# Patient Record
Sex: Male | Born: 1955 | Race: Black or African American | Hispanic: No | Marital: Single | Smoking: Current every day smoker
Health system: Southern US, Community
[De-identification: ages and names within clinical notes are randomized; demographics above are authoritative.]

## PROBLEM LIST (undated history)

## (undated) DIAGNOSIS — Z789 Other specified health status: Secondary | ICD-10-CM

## (undated) DIAGNOSIS — Z720 Tobacco use: Secondary | ICD-10-CM

## (undated) DIAGNOSIS — Z7289 Other problems related to lifestyle: Secondary | ICD-10-CM

## (undated) HISTORY — DX: Other specified health status: Z78.9

## (undated) HISTORY — DX: Other problems related to lifestyle: Z72.89

## (undated) HISTORY — PX: WISDOM TOOTH EXTRACTION: SHX21

## (undated) HISTORY — PX: ABDOMINAL HERNIA REPAIR: SHX539

## (undated) HISTORY — DX: Tobacco use: Z72.0

---

## 2010-03-19 ENCOUNTER — Emergency Department (HOSPITAL_COMMUNITY): Admission: EM | Admit: 2010-03-19 | Discharge: 2010-03-19 | Payer: Self-pay | Admitting: Emergency Medicine

## 2017-06-20 ENCOUNTER — Emergency Department (HOSPITAL_COMMUNITY): Admission: EM | Admit: 2017-06-20 | Discharge: 2017-06-20 | Discharge status: Absent without leave | Payer: Self-pay

## 2017-06-22 ENCOUNTER — Other Ambulatory Visit: Payer: Self-pay

## 2017-06-22 ENCOUNTER — Emergency Department (HOSPITAL_COMMUNITY)
Admission: EM | Admit: 2017-06-22 | Discharge: 2017-06-22 | Disposition: A | Discharge status: Home / Self Care | Payer: Medicaid Other | Attending: Emergency Medicine | Admitting: Emergency Medicine

## 2017-06-22 ENCOUNTER — Emergency Department (HOSPITAL_COMMUNITY): Payer: Medicaid Other

## 2017-06-22 ENCOUNTER — Encounter (HOSPITAL_COMMUNITY): Payer: Self-pay | Admitting: Emergency Medicine

## 2017-06-22 DIAGNOSIS — M940 Chondrocostal junction syndrome [Tietze]: Secondary | ICD-10-CM | POA: Diagnosis not present

## 2017-06-22 DIAGNOSIS — R079 Chest pain, unspecified: Secondary | ICD-10-CM | POA: Diagnosis present

## 2017-06-22 DIAGNOSIS — F17200 Nicotine dependence, unspecified, uncomplicated: Secondary | ICD-10-CM | POA: Insufficient documentation

## 2017-06-22 LAB — BASIC METABOLIC PANEL
ANION GAP: 4 — AB (ref 5–15)
BUN: 11 mg/dL (ref 6–20)
CALCIUM: 9.4 mg/dL (ref 8.9–10.3)
CO2: 27 mmol/L (ref 22–32)
Chloride: 107 mmol/L (ref 101–111)
Creatinine, Ser: 1.18 mg/dL (ref 0.61–1.24)
GFR calc Af Amer: >60 mL/min (ref >60)
GFR calc non Af Amer: >60 mL/min (ref >60)
GLUCOSE: 99 mg/dL (ref 65–99)
POTASSIUM: 4 mmol/L (ref 3.5–5.1)
SODIUM: 138 mmol/L (ref 135–145)

## 2017-06-22 LAB — CBC
HCT: 44.1 % (ref 39.0–52.0)
Hemoglobin: 14.4 g/dL (ref 13.0–17.0)
MCH: 29 pg (ref 26.0–34.0)
MCHC: 32.7 g/dL (ref 30.0–36.0)
MCV: 88.7 fL (ref 78.0–100.0)
PLATELETS: 290 10*3/uL (ref 150–400)
RBC: 4.97 MIL/uL (ref 4.22–5.81)
RDW: 14.2 % (ref 11.5–15.5)
WBC: 7 10*3/uL (ref 4.0–10.5)

## 2017-06-22 LAB — I-STAT TROPONIN, ED: TROPONIN I, POC: 0 ng/mL (ref 0.00–0.08)

## 2017-06-22 MED ORDER — CYCLOBENZAPRINE HCL 10 MG PO TABS: 10.0000 mg | ORAL_TABLET | Freq: Two times a day (BID) | ORAL | 0 refills | Status: DC | PRN

## 2017-06-22 MED ORDER — IBUPROFEN 200 MG PO TABS
400.0000 mg | ORAL_TABLET | Freq: Once | ORAL | Status: AC
  Administered 2017-06-22: 400 mg via ORAL
  Filled 2017-06-22: qty 2

## 2017-06-22 MED ORDER — NAPROXEN 500 MG PO TABS: 500.0000 mg | ORAL_TABLET | Freq: Two times a day (BID) | ORAL | 0 refills | Status: DC

## 2017-06-22 NOTE — Discharge Instructions (Signed)
Take the medications as prescribed, follow-up with a primary care doctor if the symptoms persist

## 2017-06-22 NOTE — ED Triage Notes (Signed)
Pt c/o R sharp chest pain x 2-3 weeks. Pt states pain is present with bending over, with movement and with movment of R arm. Denies SOB. Pt denies injury.

## 2017-06-22 NOTE — ED Provider Notes (Signed)
[facility]-EMERGENCY DEPT Provider Note   CSN: [id number] Arrival date & time: 06/22/17  0754     History   Chief Complaint Chief Complaint  Patient presents with  . Chest Pain    r chest    HPI George Rocha is a 62 y.o. male.  HPI Patient presents to the emergency room for evaluation of right-sided chest pain.  Patient states the symptoms started a few weeks ago.  Since then he has had constant pain on the right side of his chest.  It increases whenever he tries to bend over to make certain movements including movements of his right arm.  He denies any falls or recent injuries.  He is not having any shortness of breath.  He denies any radiation of the pain.  No numbness or weakness.  No trouble with leg swelling.  No fevers or coughing.  Patient denies any history of heart or lung disease History reviewed. No pertinent past medical history.  There are no active problems to display for this patient.   History reviewed. No pertinent surgical history.     Home Medications    Prior to Admission medications   Medication Sig Start Date End Date Taking? Authorizing Provider  cyclobenzaprine (FLEXERIL) 10 MG tablet Take 1 tablet (10 mg total) by mouth 2 (two) times daily as needed for muscle spasms. 06/22/17   Linwood Dibbles, MD  naproxen (NAPROSYN) 500 MG tablet Take 1 tablet (500 mg total) by mouth 2 (two) times daily. 06/22/17   Linwood Dibbles, MD    Family History History reviewed. No pertinent family history.  Social History Social History   Tobacco Use  . Smoking status: Current Every Day Smoker    Packs/day: 0.50  . Smokeless tobacco: Never Used  Substance Use Topics  . Alcohol use: Yes  . Drug use: No     Allergies   Patient has no known allergies.   Review of Systems Review of Systems  All other systems reviewed and are negative.    Physical Exam Updated Vital Signs BP 114/89 (BP Location: Left Arm)   Pulse (!) 56   Temp 97.7 F (36.5 C)  (Oral)   Resp 16   SpO2 100%   Physical Exam  Constitutional: He appears well-developed and well-nourished. No distress.  HENT:  Head: Normocephalic and atraumatic.  Right Ear: External ear normal.  Left Ear: External ear normal.  Eyes: Conjunctivae are normal. Right eye exhibits no discharge. Left eye exhibits no discharge. No scleral icterus.  Neck: Neck supple. No tracheal deviation present.  Cardiovascular: Normal rate, regular rhythm and intact distal pulses.  Pulmonary/Chest: Effort normal and breath sounds normal. No stridor. No respiratory distress. He has no wheezes. He has no rales.  Abdominal: Soft. Bowel sounds are normal. He exhibits no distension. There is no tenderness. There is no rebound and no guarding.  Musculoskeletal: He exhibits no edema or tenderness.  Neurological: He is alert. He has normal strength. No cranial nerve deficit (no facial droop, extraocular movements intact, no slurred speech) or sensory deficit. He exhibits normal muscle tone. He displays no seizure activity. Coordination normal.  Skin: Skin is warm and dry. No rash noted.  Psychiatric: He has a normal mood and affect.  Nursing note and vitals reviewed.    ED Treatments / Results  Labs (all labs ordered are listed, but only abnormal results are displayed) Labs Reviewed  BASIC METABOLIC PANEL - Abnormal; Notable for the following components:  Result Value   Anion gap 4 (*)    All other components within normal limits  CBC  I-STAT TROPONIN, ED    EKG  EKG Interpretation  Date/Time:  "Sunday June 22 2017 08:53:43 EST Ventricular Rate:  59 PR Interval:    QRS Duration: 102 QT Interval:  416 QTC Calculation: 413 R Axis:   66 Text Interpretation:  Sinus rhythm RSR' in V1 or V2, probably normal variant Minimal ST elevation, anterior leads Baseline wander in lead(s) II III aVL aVF V1 No old tracing to compare Confirmed by Art Levan ([id number]) on 06/22/2017 9:31:58 AM        Radiology Dg Chest 2 View  Result Date: 06/22/2017 CLINICAL DATA:  RIGHT side chest pain for 3-4 weeks, smoker EXAM: CHEST  2 VIEW COMPARISON:  None. FINDINGS: Normal heart size, mediastinal contours, and pulmonary vascularity. Mild peribronchial thickening. No pulmonary infiltrate, pleural effusion, or pneumothorax. Bones demineralized. Multiple EKG leads project over chest. IMPRESSION: Bronchitic changes without infiltrate. Electronically Signed   By: Mark  Boles M.D.   On: 06/22/2017 09:39    Procedures Procedures (including critical care time)  Medications Ordered in ED Medications  ibuprofen (ADVIL,MOTRIN) tablet 400 mg (400 mg Oral Given 06/22/17 1041)     Initial Impression / Assessment and Plan / ED Course  I have reviewed the triage vital signs and the nursing notes.  Pertinent labs & imaging results that were available during my care of the patient were reviewed by me and considered in my medical decision making (see chart for details).   Patient's symptoms are consistent with chest wall pain.  He has had several week duration.  Laboratory tests are reassuring.  I doubt acute cardiac syndrome, pneumonia, pulmonary embolism, or other emergent condition.  Plan on discharge home with medications for pain and muscle relaxants.  Follow-up with primary care doctor if not resolved  Final Clinical Impressions(s) / ED Diagnoses   Final diagnoses:  Costochondritis    ED Discharge Orders        Ordered    naproxen (NAPROSYN) 500 MG tablet  2 times daily     06/22/17 1125    cyclobenzaprine (FLEXERIL) 10 MG tablet  2 times daily PRN     01" /06/19 1125       Linwood DibblesKnapp, Eivan Gallina, MD 06/22/17 1127

## 2017-11-22 ENCOUNTER — Encounter (HOSPITAL_COMMUNITY): Payer: Self-pay | Admitting: Emergency Medicine

## 2017-11-22 ENCOUNTER — Emergency Department (HOSPITAL_COMMUNITY): Payer: Medicaid Other

## 2017-11-22 ENCOUNTER — Emergency Department (HOSPITAL_COMMUNITY)
Admission: EM | Admit: 2017-11-22 | Discharge: 2017-11-22 | Disposition: A | Discharge status: Home / Self Care | Payer: Medicaid Other | Attending: Emergency Medicine | Admitting: Emergency Medicine

## 2017-11-22 DIAGNOSIS — F17210 Nicotine dependence, cigarettes, uncomplicated: Secondary | ICD-10-CM | POA: Diagnosis not present

## 2017-11-22 DIAGNOSIS — R0789 Other chest pain: Secondary | ICD-10-CM | POA: Insufficient documentation

## 2017-11-22 DIAGNOSIS — R079 Chest pain, unspecified: Secondary | ICD-10-CM | POA: Diagnosis present

## 2017-11-22 LAB — CBC
HCT: 41.3 % (ref 39.0–52.0)
Hemoglobin: 13.3 g/dL (ref 13.0–17.0)
MCH: 28.9 pg (ref 26.0–34.0)
MCHC: 32.2 g/dL (ref 30.0–36.0)
MCV: 89.8 fL (ref 78.0–100.0)
PLATELETS: 266 10*3/uL (ref 150–400)
RBC: 4.6 MIL/uL (ref 4.22–5.81)
RDW: 13.6 % (ref 11.5–15.5)
WBC: 7.7 10*3/uL (ref 4.0–10.5)

## 2017-11-22 LAB — BASIC METABOLIC PANEL
Anion gap: 9 (ref 5–15)
BUN: 10 mg/dL (ref 6–20)
CALCIUM: 8.6 mg/dL — AB (ref 8.9–10.3)
CO2: 24 mmol/L (ref 22–32)
CREATININE: 1.18 mg/dL (ref 0.61–1.24)
Chloride: 108 mmol/L (ref 101–111)
GFR calc non Af Amer: >60 mL/min (ref >60)
GLUCOSE: 82 mg/dL (ref 65–99)
Potassium: 3.6 mmol/L (ref 3.5–5.1)
Sodium: 141 mmol/L (ref 135–145)

## 2017-11-22 LAB — I-STAT TROPONIN, ED: TROPONIN I, POC: 0 ng/mL (ref 0.00–0.08)

## 2017-11-22 MED ORDER — IBUPROFEN 200 MG PO TABS
600.0000 mg | ORAL_TABLET | Freq: Once | ORAL | Status: AC
  Administered 2017-11-22: 600 mg via ORAL
  Filled 2017-11-22: qty 3

## 2017-11-22 MED ORDER — IBUPROFEN 600 MG PO TABS: 600.0000 mg | ORAL_TABLET | Freq: Four times a day (QID) | ORAL | 0 refills | Status: DC | PRN

## 2017-11-22 NOTE — ED Notes (Signed)
Patient transported to X-ray 

## 2017-11-22 NOTE — ED Triage Notes (Signed)
Pt reports having constant mid chest pains every day for weeks to months. Reports pain is worse with bending over, movement and coughing. Denies productive cough. Was seen here before for chest pains and states he was told it was muscle related.

## 2017-11-22 NOTE — ED Provider Notes (Signed)
[facility]-EMERGENCY DEPT Provider Note   CSN: [id number] Arrival date & time: 11/22/17  1214     History   Chief Complaint Chief Complaint  Patient presents with  . Chest Pain    HPI George Rocha is a 62 y.o. male.  George Rocha is a 62 y.o. Male history of tobacco and alcohol use, otherwise healthy, presents to the emergency department for evaluation of several months of mid chest pain every day.  She reports he was seen for similar symptoms back in January and diagnosed with costochondritis, he was initially treated with Naprosyn and Robaxin but reports these medications did not help at all and he discontinued them, and has been continuing to have chest pain daily.  Pain is made worse with movement, especially with bending over or lifting things.  Patient reports he works currently at 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  Chest pain is not exertional or pleuritic, it does not radiate to the arm neck or jaw.  Patient denies any associated shortness of breath or pain with breathing.  No associated lightheadedness, dizziness or syncope.  Patient denies fevers or productive cough.  Patient has not tried anything to treat this pain recently.  No other aggravating or alleviating factors.  He denies any diaphoresis, nausea, vomiting or abdominal pain.  Patient denies any recent long distance travel or surgeries, no lower extremity pain or swelling, no history of DVT or PE, no hemoptysis.     History reviewed. No pertinent past medical history.  There are no active problems to display for this patient.   History reviewed. No pertinent surgical history.      Home Medications    Prior to Admission medications   Medication Sig Start Date End Date Taking? Authorizing Provider  cyclobenzaprine (FLEXERIL) 10 MG tablet Take 1 tablet (10 mg total) by mouth 2 (two) times daily as needed for muscle spasms. 06/22/17   Linwood Dibbles, MD  naproxen (NAPROSYN) 500  MG tablet Take 1 tablet (500 mg total) by mouth 2 (two) times daily. 06/22/17   Linwood Dibbles, MD    Family History No family history on file.  Social History Social History   Tobacco Use  . Smoking status: Current Every Day Smoker    Packs/day: 0.50  . Smokeless tobacco: Never Used  Substance Use Topics  . Alcohol use: Yes  . Drug use: No     Allergies   Patient has no known allergies.   Review of Systems Review of Systems  Constitutional: Negative for chills and fever.  HENT: Negative for congestion, rhinorrhea and sore throat.   Eyes: Negative for visual disturbance.  Respiratory: Negative for cough, chest tightness, shortness of breath and wheezing.   Cardiovascular: Positive for chest pain. Negative for palpitations and leg swelling.  Gastrointestinal: Negative for abdominal pain, nausea and vomiting.  Genitourinary: Negative for dysuria and frequency.  Musculoskeletal: Negative for arthralgias and myalgias.  Skin: Negative for color change and rash.  Neurological: Negative for dizziness and seizures.     Physical Exam Updated Vital Signs BP (!) 141/101 (BP Location: Left Arm)   Pulse 78   Temp 97.9 F (36.6 C) (Oral)   Resp 15   Ht 6\' 2"  (1.88 m)   Wt 86.2 kg (190 lb)   SpO2 98%   BMI 24.39 kg/m   Physical Exam  Constitutional: He appears well-developed and well-nourished. No distress.  HENT:  Head: Normocephalic and atraumatic.  Mouth/Throat: Oropharynx  is clear and moist.  Eyes: Right eye exhibits no discharge. Left eye exhibits no discharge.  Neck: Neck supple.  Cardiovascular: Normal rate, regular rhythm, normal heart sounds and intact distal pulses. Exam reveals no gallop and no friction rub.  No murmur heard. Pulses:      Radial pulses are 2+ on the right side, and 2+ on the left side.       Posterior tibial pulses are 2+ on the right side, and 2+ on the left side.  Pulmonary/Chest: Effort normal and breath sounds normal. No stridor. No  respiratory distress. He has no wheezes. He has no rales. He exhibits tenderness.  Respirations equal and unlabored, patient able to speak in full sentences, lungs clear to auscultation bilaterally, chest tender to palpation over the left costochondral margin  Abdominal: Soft. Bowel sounds are normal.  Neurological: He is alert. Coordination normal.  Skin: He is not diaphoretic.  Psychiatric: He has a normal mood and affect. His behavior is normal.  Nursing note and vitals reviewed.    ED Treatments / Results  Labs (all labs ordered are listed, but only abnormal results are displayed) Labs Reviewed  BASIC METABOLIC PANEL - Abnormal; Notable for the following components:      Result Value   Calcium 8.6 (*)    All other components within normal limits  CBC  I-STAT TROPONIN, ED    EKG EKG Interpretation  Date/Time:  Saturday November 22 2017 12:19:59 EDT Ventricular Rate:  82 PR Interval:    QRS Duration: 96 QT Interval:  365 QTC Calculation: 427 R Axis:   69 Text Interpretation:  Sinus rhythm RSR' in V1 or V2, right VCD or RVH Baseline wander in lead(s) V5 Confirmed by Tilden Fossaees, Elizabeth [phone]([id number]) on 11/22/2017 1:01:10 PM   Radiology Dg Chest 2 View  Result Date: 11/22/2017 CLINICAL DATA:  Left-sided chest pain and shortness of breath for 2 months. EXAM: CHEST - 2 VIEW COMPARISON:  None. FINDINGS: The heart size and mediastinal contours are within normal limits. Both lungs are clear. No evidence of pneumothorax or pleural effusion. The visualized skeletal structures are unremarkable. IMPRESSION: No active cardiopulmonary disease. Electronically Signed   By: Myles RosenthalJohn  Stahl M.D.   On: 11/22/2017 13:29    Procedures Procedures (including critical care time)  Medications Ordered in ED Medications  ibuprofen (ADVIL,MOTRIN) tablet 600 mg (has no administration in time range)     Initial Impression / Assessment and Plan / ED Course  I have reviewed the triage vital signs and the nursing  notes.  Pertinent labs & imaging results that were available during my care of the patient were reviewed by me and considered in my medical decision making (see chart for details).  Patient is to be discharged with recommendation to follow up with PCP in regards to today's hospital visit. Chest pain is not likely of cardiac or pulmonary etiology d/t presentation, PERC negative, VSS, no tracheal deviation, no JVD or new murmur, RRR, breath sounds equal bilaterally, EKG without acute abnormalities, negative troponin, and negative CXR.  Chest pain is reproducible on exam with palpation over the left costochondral margin.  Discussed treatment with NSAIDs and over-the-counter muscle rubs.  Discussed importance of establishing a primary care doctor, patient's alcohol and tobacco use does put him at risk for esophageal cancer.  Pt has been advised to return to the ED if CP becomes exertional, associated with diaphoresis or nausea, radiates to left jaw/arm, worsens or becomes concerning in any way. Pt appears reliable for  follow up and is agreeable to discharge.   Case has been discussed with and seen by Dr. Madilyn Hook who agrees with the above plan to discharge.    Final Clinical Impressions(s) / ED Diagnoses   Final diagnoses:  Chest wall pain  Atypical chest pain    ED Discharge Orders        Ordered    ibuprofen (ADVIL,MOTRIN) 600 MG tablet  Every 6 hours PRN     11/22/17 1424       Dartha Lodge, [place] 11/22/17 1425    Tilden Fossa, MD 11/25/17 [phone]

## 2017-11-22 NOTE — Discharge Instructions (Signed)
Evaluation today is very reassuring, EKG, labs and chest x-ray do not suggest an acute problem with your heart or lungs causing your symptoms.  This is likely musculoskeletal pain.  Please continue taking ibuprofen regularly, you can also try over-the-counter salon pas patches or Biofreeze over the area where you are having pain.  It is extremely important that you follow-up with primary care, you can follow-up with the [facility] or use the phone number provided in your paperwork today to establish a primary care doctor.  As we discussed you are at risk for esophageal cancer with your smoking and alcohol use and this should be evaluated.

## 2018-04-06 ENCOUNTER — Other Ambulatory Visit: Payer: Self-pay

## 2018-04-06 ENCOUNTER — Emergency Department (HOSPITAL_COMMUNITY)
Admission: EM | Admit: 2018-04-06 | Discharge: 2018-04-06 | Disposition: A | Discharge status: Home / Self Care | Payer: Medicaid Other | Attending: Emergency Medicine | Admitting: Emergency Medicine

## 2018-04-06 ENCOUNTER — Encounter (HOSPITAL_COMMUNITY): Payer: Self-pay | Admitting: Emergency Medicine

## 2018-04-06 DIAGNOSIS — Z5321 Procedure and treatment not carried out due to patient leaving prior to being seen by health care provider: Secondary | ICD-10-CM | POA: Insufficient documentation

## 2018-04-06 DIAGNOSIS — R42 Dizziness and giddiness: Secondary | ICD-10-CM | POA: Diagnosis present

## 2018-04-06 LAB — CBG MONITORING, ED: Glucose-Capillary: 93 mg/dL (ref 70–99)

## 2018-04-06 NOTE — ED Triage Notes (Addendum)
Pt reports he was upstairs with his mom who was about to have a procedure done when he began feeling lightheaded and layed down, was diaphoretic. He was brought down by rapid response. Pt denise pain anywhere. Pt states "I feel fine now, nothing is wrong with me."

## 2018-04-06 NOTE — ED Notes (Signed)
Pt wants to leave. Refused blood work and states he "truly feels better and wants to go be with his family."

## 2018-08-29 ENCOUNTER — Emergency Department (HOSPITAL_COMMUNITY)
Admission: EM | Admit: 2018-08-29 | Discharge: 2018-08-29 | Disposition: A | Discharge status: Home / Self Care | Payer: Medicaid - Out of State | Attending: Emergency Medicine | Admitting: Emergency Medicine

## 2018-08-29 ENCOUNTER — Other Ambulatory Visit: Payer: Self-pay

## 2018-08-29 ENCOUNTER — Encounter (HOSPITAL_COMMUNITY): Payer: Self-pay

## 2018-08-29 DIAGNOSIS — M79601 Pain in right arm: Secondary | ICD-10-CM | POA: Diagnosis present

## 2018-08-29 DIAGNOSIS — R202 Paresthesia of skin: Secondary | ICD-10-CM

## 2018-08-29 DIAGNOSIS — F17210 Nicotine dependence, cigarettes, uncomplicated: Secondary | ICD-10-CM | POA: Insufficient documentation

## 2018-08-29 MED ORDER — METHYLPREDNISOLONE 4 MG PO TBPK: ORAL_TABLET | ORAL | 0 refills | Status: DC

## 2018-08-29 MED ORDER — KETOROLAC TROMETHAMINE 60 MG/2ML IM SOLN
60.0000 mg | Freq: Once | INTRAMUSCULAR | Status: AC
  Administered 2018-08-29: 60 mg via INTRAMUSCULAR
  Filled 2018-08-29: qty 2

## 2018-08-29 MED ORDER — IBUPROFEN 800 MG PO TABS: 800.0000 mg | ORAL_TABLET | Freq: Three times a day (TID) | ORAL | 0 refills | Status: AC | PRN

## 2018-08-29 NOTE — ED Triage Notes (Signed)
Right arm pain from shoulder to elbow states it has been going on for about 2 weeks and gets worse when he is lifting or using it a lot good csm noted and numbness comes and goes.

## 2018-08-29 NOTE — ED Provider Notes (Signed)
[facility]-EMERGENCY DEPT Provider Note   CSN: [id number] Arrival date & time: 08/29/18  1956    History   Chief Complaint Chief Complaint  George Rocha presents with  . Arm Pain    HPI George Rocha is a 63 y.o. male.     The history is provided by the George Rocha.  Illness  Location:  Right arm Quality:  Pain and numbness/tingling Severity:  Mild Onset quality:  Gradual Timing:  Intermittent Progression:  Waxing and waning Chronicity:  New Context:  George Rocha states pain in RUE arm between shoulder and elbow over the last few weeks that is worse when lifting objects.  Relieved by:  Nothing Worsened by:  Movement Associated symptoms: no abdominal pain, no chest pain, no congestion, no cough, no ear pain, no fatigue, no fever, no headaches, no rash, no rhinorrhea, no shortness of breath, no sore throat and no vomiting     History reviewed. No pertinent past medical history.  There are no active problems to display for this George Rocha.   History reviewed. No pertinent surgical history.      Home Medications    Prior to Admission medications   Medication Sig Start Date End Date Taking? Authorizing Provider  cyclobenzaprine (FLEXERIL) 10 MG tablet Take 1 tablet (10 mg total) by mouth 2 (two) times daily as needed for muscle spasms. 06/22/17   Linwood Dibbles, MD  ibuprofen (ADVIL,MOTRIN) 800 MG tablet Take 1 tablet (800 mg total) by mouth every 8 (eight) hours as needed for up to 30 days for mild pain. 08/29/18 09/28/18  Meeyah Ovitt, DO  methylPREDNISolone (MEDROL DOSEPAK) 4 MG TBPK tablet Follow package instructions 08/29/18   Virgina Norfolk, DO  naproxen (NAPROSYN) 500 MG tablet Take 1 tablet (500 mg total) by mouth 2 (two) times daily. 06/22/17   Linwood Dibbles, MD    Family History History reviewed. No pertinent family history.  Social History Social History   Tobacco Use  . Smoking status: Current Every Day Smoker    Packs/day: 0.50  . Smokeless tobacco:  Never Used  Substance Use Topics  . Alcohol use: Yes  . Drug use: No     Allergies   George Rocha has no known allergies.   Review of Systems Review of Systems  Constitutional: Negative for chills, fatigue and fever.  HENT: Negative for congestion, ear pain, rhinorrhea and sore throat.   Eyes: Negative for pain and visual disturbance.  Respiratory: Negative for cough and shortness of breath.   Cardiovascular: Negative for chest pain and palpitations.  Gastrointestinal: Negative for abdominal pain and vomiting.  Genitourinary: Negative for dysuria and hematuria.  Musculoskeletal: Negative for arthralgias, back pain, gait problem and joint swelling.  Skin: Negative for color change and rash.  Neurological: Positive for numbness. Negative for dizziness, tremors, seizures, syncope, facial asymmetry, speech difficulty, weakness, light-headedness and headaches.  All other systems reviewed and are negative.    Physical Exam Updated Vital Signs  ED Triage Vitals  Enc Vitals Group     BP 08/29/18 2003 115/80     Pulse Rate 08/29/18 2003 80     Resp 08/29/18 2003 18     Temp 08/29/18 2003 97.9 F (36.6 C)     Temp Source 08/29/18 2003 Oral     SpO2 08/29/18 2003 97 %     Weight 08/29/18 2007 185 lb (83.9 kg)     Height 08/29/18 2007 6' (1.829 m)     Head Circumference --      Peak  Flow --      Pain Score 08/29/18 2007 8     Pain Loc --      Pain Edu? --      Excl. in GC? --     Physical Exam Vitals signs and nursing note reviewed.  Constitutional:      General: George Rocha is not in acute distress.    Appearance: George Rocha is well-developed. George Rocha is not ill-appearing.  HENT:     Head: Normocephalic and atraumatic.     Nose: Nose normal.     Mouth/Throat:     Mouth: Mucous membranes are moist.  Eyes:     Extraocular Movements: Extraocular movements intact.     Conjunctiva/sclera: Conjunctivae normal.     Pupils: Pupils are equal, round, and reactive to light.  Neck:     Musculoskeletal:  Normal range of motion and neck supple. No muscular tenderness.  Cardiovascular:     Rate and Rhythm: Normal rate and regular rhythm.     Heart sounds: No murmur.  Pulmonary:     Effort: Pulmonary effort is normal. No respiratory distress.     Breath sounds: Normal breath sounds.  Abdominal:     Palpations: Abdomen is soft.     Tenderness: There is no abdominal tenderness.  Musculoskeletal: Normal range of motion.        General: Tenderness present.     Comments: George Rocha tender within the right bicep area on exam, no tenderness around the shoulder, negative Spurling's test, no midline spinal tenderness, normal range of motion in neck  Skin:    General: Skin is warm and dry.  Neurological:     General: No focal deficit present.     Mental Status: George Rocha is alert and oriented to person, place, and time.     Cranial Nerves: No cranial nerve deficit.     Sensory: No sensory deficit.     Motor: No weakness.     Coordination: Coordination normal.     Gait: Gait normal.     Comments: 5+5 strength throughout, normal sensation throughout, normal finger-to-nose finger, normal speech      ED Treatments / Results  Labs (all labs ordered are listed, but only abnormal results are displayed) Labs Reviewed - No data to display  EKG None  Radiology No results found.  Procedures Procedures (including critical care time)  Medications Ordered in ED Medications  ketorolac (TORADOL) injection 60 mg (has no administration in time range)     Initial Impression / Assessment and Plan / ED Course  I have reviewed the triage vital signs and the nursing notes.  Pertinent labs & imaging results that were available during my care of the George Rocha were reviewed by me and considered in my medical decision making (see chart for details).     George Rocha is a 63 year old male with no significant medical history who presents to the ED with right upper arm pain, paresthesias for the last 3 weeks on and  off.  George Rocha with normal vitals.  No fever.  Overall George Rocha with normal neurological exam.  No numbness or weakness of the right upper extremity. George Rocha states that George Rocha has mostly right shoulder pain down to right elbow pain on and off with intermittent tingling feeling especially when George Rocha is lifting boxes at work.  George Rocha denies any weakness in George Rocha legs, speech problems, other stroke symptoms.  George Rocha has normal strength and sensation throughout on exam.  There is some reproducible tenderness in the bicep area.  Possible  nerve impingement versus inflammation of muscle or tendon.  Possible repetitive use injury.  Does not have any cervical pain.  No midline spinal tenderness.  Normal range of motion of the neck without any pain.  Negative Spurling's test.  Overall pain appears musculoskeletal or peripheral nerve in etiology.  George Rocha was given Toradol shot.  Given prescription for Motrin, Medrol Dosepak.  Recommend establishing primary care through George Rocha insurance company.  Also given information to possibly follow-up with neurology for possible nerve conduction test.  Written for light duty for work given return precautions.  Discharged in good condition.  This chart was dictated using voice recognition software.  Despite best efforts to proofread,  errors can occur which can change the documentation meaning.    Final Clinical Impressions(s) / ED Diagnoses   Final diagnoses:  Right arm pain  Arm paresthesia, right    ED Discharge Orders         Ordered    methylPREDNISolone (MEDROL DOSEPAK) 4 MG TBPK tablet     08/29/18 2236    ibuprofen (ADVIL,MOTRIN) 800 MG tablet  Every 8 hours PRN     08/29/18 2236           Virgina NorfolkCuratolo, Wai Minotti, DO 08/29/18 2254

## 2018-10-05 ENCOUNTER — Telehealth: Payer: Self-pay | Admitting: Neurology

## 2018-10-05 NOTE — Telephone Encounter (Signed)
Dr. Allena Katz,   Can this patient be an E visit? He is a New Patient. He called in today saying that his arm is really bothering him. Please Advise.  Thanks [facility]

## 2018-10-05 NOTE — Telephone Encounter (Signed)
Patient is calling regarding wanting to be seen as soon as possible. Can this be an E Visit? Please Advise. Thanks

## 2018-10-06 NOTE — Telephone Encounter (Signed)
Dr. Allena Katz said she thinks she wrote ok for evisit on her list.  Do you have the list?

## 2018-10-06 NOTE — Telephone Encounter (Signed)
Yes she did. Thank you

## 2018-11-06 ENCOUNTER — Ambulatory Visit: Payer: 59 | Admitting: Neurology

## 2018-11-11 ENCOUNTER — Encounter: Payer: Self-pay | Admitting: *Deleted

## 2018-11-13 ENCOUNTER — Ambulatory Visit: Payer: Medicaid Other | Admitting: Neurology

## 2018-12-30 ENCOUNTER — Encounter: Payer: Self-pay | Admitting: Neurology

## 2018-12-30 ENCOUNTER — Other Ambulatory Visit: Payer: Self-pay

## 2018-12-30 ENCOUNTER — Ambulatory Visit (INDEPENDENT_AMBULATORY_CARE_PROVIDER_SITE_OTHER): Payer: Medicaid Other | Admitting: Neurology

## 2018-12-30 ENCOUNTER — Other Ambulatory Visit: Payer: Medicaid Other

## 2018-12-30 VITALS — BP 113/75 | HR 69 | Ht 72.5 in | Wt 178.0 lb

## 2018-12-30 DIAGNOSIS — R292 Abnormal reflex: Secondary | ICD-10-CM | POA: Diagnosis not present

## 2018-12-30 DIAGNOSIS — Z7289 Other problems related to lifestyle: Secondary | ICD-10-CM | POA: Diagnosis not present

## 2018-12-30 DIAGNOSIS — Z720 Tobacco use: Secondary | ICD-10-CM | POA: Insufficient documentation

## 2018-12-30 DIAGNOSIS — R29898 Other symptoms and signs involving the musculoskeletal system: Secondary | ICD-10-CM

## 2018-12-30 DIAGNOSIS — F1090 Alcohol use, unspecified, uncomplicated: Secondary | ICD-10-CM | POA: Insufficient documentation

## 2018-12-30 DIAGNOSIS — Z789 Other specified health status: Secondary | ICD-10-CM

## 2018-12-30 DIAGNOSIS — M5412 Radiculopathy, cervical region: Secondary | ICD-10-CM | POA: Diagnosis not present

## 2018-12-30 MED ORDER — CYCLOBENZAPRINE HCL 5 MG PO TABS: 5.0000 mg | ORAL_TABLET | Freq: Every day | ORAL | 3 refills | Status: AC

## 2018-12-30 MED ORDER — GABAPENTIN 300 MG PO CAPS: ORAL_CAPSULE | ORAL | 3 refills | Status: AC

## 2018-12-30 NOTE — Progress Notes (Signed)
[facility] Clinic Note - Initial Visit   Date: 12/30/18  George Rocha MRN: [id number] DOB: 1956/01/29   Dear Dr. Lockie Molauratolo:   Thank you for your kind referral of George Rocha for consultation of right arm paresthesias. Although his history is well known to you, please allow us to reiterate it for the purpose of our medical record. The patient was accompanied to the clinic by self.    History of Present Illness: George Rocha is a 63 y.o. right-handed male with tobacco and alcohol use presenting for evaluation of right arm numbness/tingling.  Starting early 2020, he began having numbness/tingling over the right shoulder and upper arm.  It does not radiate into the forearm or hand.  Symptoms are constant and worse with rest.  He does not notice it as much when active.  He finds it more comfortable to sleep on his right side.  He denies neck pain.  He has mild weakness in the arm.  He does not have similar symptoms on the left side.   He smokes 0.5PPD for 30-40 years and drink 5-6 (12 oz) cans of beer daily.  He is aware that he needs to try to stop but has not tried.  He denies shortness of breath, memory problems, or numbness/tingling of the feet.    Past Medical History:  Diagnosis Date  . Alcohol use   . Tobacco use     Past Surgical History:  Procedure Laterality Date  . ABDOMINAL HERNIA REPAIR    . WISDOM TOOTH EXTRACTION       Medications:  No outpatient encounter medications on file as of 12/30/2018.   No facility-administered encounter medications on file as of 12/30/2018.     Allergies: No Known Allergies  Family History: Family History  Problem Relation Age of Onset  . Healthy Mother   . Healthy Father   . Healthy Brother   No history of stroke, CAD, cancer, or neurological condition.  Social History: Social History   Tobacco Use  . Smoking status: Current Every Day Smoker    Packs/day: 0.50    Years: 40.00    Pack years: 20.00   . Smokeless tobacco: Never Used  Substance Use Topics  . Alcohol use: Yes    Alcohol/week: 5.0 - 6.0 standard drinks    Types: 5 - 6 Cans of beer per week    Comment: x 20 years  . Drug use: No   Social History   Social History Narrative   Pt is right handed.   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   EYES: No visual changes or eye pain ENT: No hearing changes.  No history of nose bleeds.   RESPIRATORY: No cough, wheezing and shortness of breath.   CARDIOVASCULAR: Negative for chest pain, and palpitations.   GI: Negative for abdominal discomfort, blood in stools or black stools.  No recent change in bowel habits.   GU:  No history of incontinence.   MUSCLOSKELETAL: No history of joint pain or swelling.  No myalgias.   SKIN: Negative for lesions, rash, and itching.   HEMATOLOGY/ONCOLOGY: Negative for prolonged bleeding, bruising easily, and swollen nodes.  No history of cancer.   ENDOCRINE: Negative for cold or heat intolerance, polydipsia or goiter.   PSYCH:  No depression or anxiety symptoms.  NEURO: As Above.   Vital Signs:  BP 113/75   Pulse 69   Ht 6' 0.5" (1.842 m)   Wt 178 lb (80.7 kg)   SpO2 94%   BMI 23.81 kg/m    General Medical Exam:   General:  Well appearing, comfortable.   Eyes/ENT: see cranial nerve examination.   Neck:   No carotid bruits. Respiratory:  Clear to auscultation, good air entry bilaterally.   Cardiac:  Regular rate and rhythm, no murmur.   Extremities:  No deformities, edema, or skin discoloration.  Skin:  No rashes or lesions.  Neurological Exam: MENTAL STATUS including orientation to time, place, person, recent and remote memory, attention span and concentration, language, and fund of knowledge is normal.  Speech is not dysarthric.  CRANIAL NERVES: II:  No visual field  defects.  Unremarkable fundi.   III-IV-VI:  Anisocoria with R pupil larger than left, both are reactive to light.  Normal conjugate, extra-ocular eye movements in all directions of gaze.  No nystagmus.  No ptosis.   V:  Normal facial sensation.    VII:  Normal facial symmetry and movements.   VIII:  Normal hearing and vestibular function.   IX-X:  Normal palatal movement.   XI:  Normal shoulder shrug and head rotation.   XII:  Normal tongue strength and range of motion, no deviation or fasciculation.  MOTOR:  No atrophy, fasciculations or abnormal movements.  No pronator drift.   Upper Extremity:  Right  Left  Deltoid  5-/5   5/5   Biceps  5/5   5/5   Triceps  5/5   5/5   Infraspinatus 4+/5  5/5  Medial pectoralis 5/5  5/5  Wrist extensors  5/5   5/5   Wrist flexors  5/5   5/5   Finger extensors  5/5   5/5   Finger flexors  5/5   5/5   Dorsal interossei  5/5   5/5   Abductor pollicis  5/5   5/5   Tone (Ashworth scale)  0  0   Lower Extremity:  Right  Left  Hip flexors  5/5   5/5   Hip extensors  5/5   5/5   Adductor 5/5  5/5  Abductor 5/5  5/5  Knee flexors  5/5   5/5   Knee extensors  5/5   5/5   Dorsiflexors  5/5   5/5   Plantarflexors  5/5   5/5   Toe extensors  5/5   5/5   Toe flexors  5/5   5/5   Tone (Ashworth scale)  0  0   MSRs:  Right        Left                  brachioradialis 3+  3+  biceps 3+  3+  triceps 3+  3+  patellar 3+  3+  ankle jerk 2+  2+  Hoffman no  no  plantar response down  down   SENSORY:  Normal and symmetric perception of light touch, pinprick, vibration, and proprioception.  Romberg's sign positive.   COORDINATION/GAIT: Normal finger-to- nose-finger and heel-to-shin.  Intact rapid alternating movements bilaterally.  Gait narrow based and stable. Tandem gait intact.    IMPRESSION: 1.  Cervical radiculopathy affecting the right C5 distribution.  Exam shows weakness in C5 myotomes as well generalized hyperreflexia.    - Start physical  therapy for right arm strengthening   - MRI cervical spine  wo contrast to evaluate for nerve impingement  - Start flexeril 5mg  at bedtime  - Start gabapentin 300mg  at bedtime x 1 week, then increase to 300mg  BID.  Side effects discussed  2.  Alcohol dependence  - patient education of health implications of alcohol abuse  - check vitamin B12, vitamin B1, folate, copper to be sure right arm symptoms are not secondary to vitamin deficiency  3.  Tobacco abuse.   Currently smoking 0.5 packs/day x 40 yr.    - Patient was informed of the dangers of tobacco abuse including stroke, cancer, and MI, as well as benefits of tobacco cessation.  - He expresses interest in quitting and was provided with resources to help him  - Approximately 5 mins were spent counseling patient cessation techniques. We discussed various methods to help quit smoking, including deciding on a date to quit, joining a support group, pharmacological agents- nicotine gum/patch/lozenges, chantix.   - I will reassess his progress at the next follow-up visit  4.  Patient will be referred to establish care with PCP    Further recommendations pending results.   Thank you for allowing me to participate in patient's care.  If I can answer any additional questions, I would be pleased to do so.    Sincerely,    Jesyca Weisenburger K. Posey Pronto, DO

## 2018-12-30 NOTE — Patient Instructions (Addendum)
Start taking flexeril 5mg  at bedtime   After taking flexeril for 3 days, start gabapentin 300mg  at bedtime x 1 week, then increase to 1 tablet twice daily  You will have blood work performed today. Your provider has requested that you have labwork completed today. Please go to [facility] ([id number]) on the second floor of this building before leaving the office today. You do not need to check in. If you are not called within 15 minutes please check with the front desk.    You will be referred to physical therapy for your right arm Out patient physical therapy will contact you.  MRI cervical spine will be ordered. We have sent a referral to [facility] for your MRI and they will call you directly to schedule your appointment. They are located at [place]. If you need to contact them directly please call [phone].    Please establish care with a primary care doctor  Patient care centerPhone: [phone]   Community health and wellness Phone: [phone]   Please try to stop alcohol and tobacco use,hand out given.

## 2019-01-12 ENCOUNTER — Encounter: Payer: Self-pay | Admitting: Physical Therapy

## 2019-01-12 ENCOUNTER — Ambulatory Visit: Payer: Medicaid Other | Attending: Neurology | Admitting: Physical Therapy

## 2019-01-12 ENCOUNTER — Other Ambulatory Visit: Payer: Self-pay

## 2019-01-12 DIAGNOSIS — G8929 Other chronic pain: Secondary | ICD-10-CM

## 2019-01-12 DIAGNOSIS — M542 Cervicalgia: Secondary | ICD-10-CM

## 2019-01-12 DIAGNOSIS — M25511 Pain in right shoulder: Secondary | ICD-10-CM | POA: Diagnosis present

## 2019-01-12 NOTE — Therapy (Signed)
[facility] [address] [place], [place], [address] Phone: [phone]   Fax:  [phone]  Physical Therapy Evaluation  Patient Details  Name: George Rocha MRN: [id number] Date of Birth: [birth year] Referring Provider (PT): Glendale ChardPatel, Donika K, DO   Encounter Date: 01/12/2019  PT End of Session - 01/12/19 0944    Visit Number  1    Number of Visits  4    Date for PT Re-Evaluation  02/16/19    Authorization Type  [employer] MCD? (front desk advised to follow MCD guidelines and authorization was submitted 01/12/19)    PT Start Time  0900    PT Stop Time  0935    PT Time Calculation (min)  35 min    Activity Tolerance  Patient tolerated treatment well    Behavior During Therapy  [facility] for tasks assessed/performed       Past Medical History:  Diagnosis Date  . Alcohol use   . Tobacco use     Past Surgical History:  Procedure Laterality Date  . ABDOMINAL HERNIA REPAIR    . WISDOM TOOTH EXTRACTION      There were no vitals filed for this visit.   Subjective Assessment - 01/12/19 0902    Subjective  Starting early 2020, he began having numbness/tingling over the right shoulder and upper arm that does not go past the elbow. Pain intermittent with activity and does not bother him that much when he is active but bothers him when he stops.    Pertinent History  no significant PMH    Limitations  Lifting    How long can you sit comfortably?  depends    Diagnostic tests  MRI scheduled for 02/05/19    Patient Stated Goals  get rid of the pain    Currently in Pain?  Yes    Pain Score  8     Pain Location  Shoulder    Pain Orientation  Right    Pain Descriptors / Indicators  Aching;Numbness    Pain Type  Chronic pain    Pain Radiating Towards  Rt shoulder down to elbow    Aggravating Factors   sometimes activity, sometimes when he is resting, he cant sleep on his Lt side but can on his Rt side    Pain Relieving Factors  nothing          [facility] PT Assessment - 01/12/19 0001      Assessment   Medical Diagnosis  Cervical pain with radiculopathy, Rt arm impingment and weakness    Referring Provider (PT)  Nita SicklePatel, Donika K, DO    Onset Date/Surgical Date  --   Pain started early 2020   Hand Dominance  Right    Next MD Visit  nothing scheduled    Prior Therapy  none      Precautions   Precautions  None      Restrictions   Weight Bearing Restrictions  No      Balance Screen   Has the patient fallen in the past 6 months  No      Prior Function   Level of Independence  Independent    Vocation  Part time employment    Vocation Requirements  works at [employer], loading trucks    Leisure  nothing      Cognition   Overall Cognitive Status  Within Functional Limits for tasks assessed      Observation/Other Assessments   Focus on Therapeutic Outcomes (FOTO)  not done, MCD      Sensation   Light Touch  Appears Intact      Coordination   Gross Motor Movements are Fluid and Coordinated  Yes      Posture/Postural Control   Posture Comments  holds head in slight sidebend to Rt, with depressed shoulder       ROM / Strength   AROM / PROM / Strength  AROM;Strength      AROM   Overall AROM Comments  shoulder ROM WNL bilat    AROM Assessment Site  Cervical    Cervical Flexion  WNL    Cervical Extension  25%    Cervical - Right Side Bend  50%    Cervical - Left Side Bend  50%    Cervical - Right Rotation  75%    Cervical - Left Rotation  75%      Strength   Strength Assessment Site  Shoulder;Elbow    Right/Left Shoulder  Right    Right Shoulder Flexion  5/5    Right Shoulder ABduction  4+/5    Right Shoulder Internal Rotation  5/5    Right Shoulder External Rotation  4/5    Right/Left Elbow  Right    Right Elbow Flexion  5/5    Right Elbow Extension  4/5      Palpation   Palpation comment  denies TTP      Special Tests   Other special tests  +spulings on Rt, + shoulder impingment tests                 Objective measurements completed on examination: See above findings.      [facility] Adult PT Treatment/Exercise - 01/12/19 0001      Modalities   Modalities  Cryotherapy;Electrical Stimulation      Cryotherapy   Number Minutes Cryotherapy  10 Minutes    Cryotherapy Location  Shoulder    Type of Cryotherapy  Ice pack      Electrical Stimulation   Electrical Stimulation Location  Rt shoulder and neck    Electrical Stimulation Action  IFC 10 min with ice    Electrical Stimulation Parameters  to tolerance    Electrical Stimulation Goals  Pain             PT Education - 01/12/19 0943    Education Details  HEP, POC, exam findings, TENS    Person(s) Educated  Patient    Methods  Explanation;Demonstration;Verbal cues;Handout    Comprehension  Verbalized understanding;Need further instruction          PT Long Term Goals - 01/12/19 0950      PT LONG TERM GOAL #1   Title  Pt will be I and compliant with HEP. (Target goal for all goals 6 weeks 02/16/19)    Baseline  no HEP until today    Status  New      PT LONG TERM GOAL #2   Title  Pt will improve neck ROM to [facility] at least 75% to improve functional mobility    Baseline  25% extension, 50% sidebending    Status  New      PT LONG TERM GOAL #3   Title  Pt will improve Rt shoulder and elbow strength to at least 5-/5 MMT to improve function    Baseline  4/5 ER, 4+/5 abd, 4/5 elbow extension    Status  New      PT LONG TERM GOAL #4   Title  Pt report overall 50% pain reduction with usual activity and sleeping    Baseline  8/10    Status  New             Plan - 01/12/19 0946    Clinical Impression Statement  Pt presents with Rt sided cervical radiculopathy and impingment. He has a MRI scheduled for 02/05/19. He has decreased shoulder/elbow strength, decreaesed neck ROM, and increased pain limiting his function and sleep. He will benefit from skilled PT to address his deficits.     Examination-Activity Limitations  Reach Overhead;Lift;Carry;Sleep    Examination-Participation Restrictions  Community Activity;Other   work   Merchant navy officer  Evolving/Moderate complexity    Clinical Decision Making  Moderate    Rehab Potential  Good    PT Frequency  1x / week    PT Duration  4 weeks    PT Treatment/Interventions  Cryotherapy;Electrical Stimulation;Iontophoresis 4mg /ml Dexamethasone;Moist Heat;Ultrasound;Therapeutic activities;Therapeutic exercise;Neuromuscular re-education;Manual techniques;Passive range of motion;Dry needling;Joint Manipulations;Spinal Manipulations;Taping    PT Next Visit Plan  how was TENS and HEP?, consider modalaties for pain, needs shoulder stabilizaiton and strength, may benefit from DN, taping, cervical/shoulder mobs    PT Home Exercise Plan  chin tucks, UT stretch, cerv ext SNAG, rows, ext, ER with band    Consulted and Agree with Plan of Care  Patient       Patient will benefit from skilled therapeutic intervention in order to improve the following deficits and impairments:  Decreased activity tolerance, Decreased range of motion, Decreased strength, Increased muscle spasms, Postural dysfunction, Pain  Visit Diagnosis: 1. Cervicalgia   2. Chronic right shoulder pain        Problem List Patient Active Problem List   Diagnosis Date Noted  . Tobacco use 12/30/2018  . Cervical radiculopathy 12/30/2018  . Alcohol use 12/30/2018    Silvestre Mesi 01/12/2019, 9:56 AM  [facility] [address] [place], [place], [address] Phone: [phone]   Fax:  [phone]  Name: George Rocha MRN: [id number] Date of Birth: 1956/05/09

## 2019-01-12 NOTE — Patient Instructions (Addendum)
Access Code: [email or web address]  URL: [email or web address]  Date: 01/12/2019  Prepared by: Elsie Ra   Exercises  Seated Cervical Sidebending Stretch - 3 sets - 30 hold - 2x daily - 6x weekly  Seated Cervical Retraction - 10 reps - 3 sets - 2x daily - 6x weekly  Cervical Extension AROM with Strap - 10 reps - 1-2 sets - 5 hold - 2x daily - 6x weekly  Standing Shoulder Row with Anchored Resistance - 10 reps - 2-3 sets - 2x daily - 6x weekly  Shoulder Extension with Resistance - Neutral - 10 reps - 2-3 sets - 2x daily - 6x weekly  Shoulder External Rotation with Anchored Resistance - 10 reps - 3 sets - 2x daily - 6x weekly   TENS UNIT: This is helpful for muscle pain and spasm.   Search and Purchase a TENS 7000 2nd edition at www.tenspros.com. It should be less than $30.     TENS unit instructions: Do not shower or bathe with the unit on Turn the unit off before removing electrodes or batteries If the electrodes lose stickiness add a drop of water to the electrodes after they are disconnected from the unit and place on plastic sheet. If you continued to have difficulty, call the TENS unit company to purchase more electrodes. Do not apply lotion on the skin area prior to use. Make sure the skin is clean and dry as this will help prolong the life of the electrodes. After use, always check skin for unusual red areas, rash or other skin difficulties. If there are any skin problems, does not apply electrodes to the same area. Never remove the electrodes from the unit by pulling the wires. Do not use the TENS unit or electrodes other than as directed. Do not change electrode placement without consultating your therapist or physician. Keep 2 fingers with between each electrode. Wear time ratio is 2:1, on to off times.    For example on for 30 minutes off for 15 minutes and then on for 30 minutes off for 15 minutes

## 2019-01-21 ENCOUNTER — Other Ambulatory Visit: Payer: Self-pay

## 2019-01-21 ENCOUNTER — Encounter: Payer: Self-pay | Admitting: Physical Therapy

## 2019-01-21 ENCOUNTER — Ambulatory Visit: Payer: Medicaid Other | Attending: Neurology | Admitting: Physical Therapy

## 2019-01-21 DIAGNOSIS — M25511 Pain in right shoulder: Secondary | ICD-10-CM | POA: Diagnosis present

## 2019-01-21 DIAGNOSIS — M542 Cervicalgia: Secondary | ICD-10-CM | POA: Insufficient documentation

## 2019-01-21 DIAGNOSIS — G8929 Other chronic pain: Secondary | ICD-10-CM | POA: Insufficient documentation

## 2019-01-21 NOTE — Therapy (Addendum)
[place], [place], [address] Phone: [phone]   Fax:  [phone]  Physical Therapy Treatment / Discharge  Patient Details  Name: George Rocha MRN: [id number] Date of Birth: 12/24/1955 Referring Provider (PT): Alda Berthold, DO   Encounter Date: 01/21/2019  PT End of Session - 01/21/19 1014    Visit Number  2    Number of Visits  4    Date for PT Re-Evaluation  02/16/19    Authorization - Visit Number  1    Authorization - Number of Visits  3    PT Start Time  1015    PT Stop Time  1103    PT Time Calculation (min)  48 min    Activity Tolerance  Patient tolerated treatment well    Behavior During Therapy  [facility] for tasks assessed/performed       Past Medical History:  Diagnosis Date  . Alcohol use   . Tobacco use     Past Surgical History:  Procedure Laterality Date  . ABDOMINAL HERNIA REPAIR    . WISDOM TOOTH EXTRACTION      There were no vitals filed for this visit.  Subjective Assessment - 01/21/19 1015    Subjective  "Everything still about the same, pain is still going down the R arm"    Currently in Pain?  Yes    Pain Score  8     Pain Location  Shoulder    Pain Orientation  Right    Pain Descriptors / Indicators  Aching    Pain Type  Chronic pain    Pain Onset  More than a month ago    Pain Frequency  Intermittent    Aggravating Factors   unsure                       [facility] Adult PT Treatment/Exercise - 01/21/19 0001      Exercises   Exercises  Neck;Shoulder      Neck Exercises: Supine   Neck Retraction  10 reps;5 secs   verbal cues for proper form     Shoulder Exercises: Supine   Protraction  Strengthening;Right;10 reps   x 2 sets    Other Supine Exercises  shoulder flexion while sustained slight horizontal abduciton with yellow theranad 2 x 10    Other Supine Exercises  scapular retracton with bil ER 2 x 10    with yellow band     Cryotherapy   Number Minutes Cryotherapy  10 Minutes    Cryotherapy Location  Shoulder   with pt in sitting   Type of Cryotherapy  Ice pack      Electrical Stimulation   Electrical Stimulation Location  Rt shoulder     Electrical Stimulation Action  IFC     Electrical Stimulation Parameters  x 10 min , 100% scan, L 6.5     Electrical Stimulation Goals  Pain      Manual Therapy   Manual Therapy  Joint mobilization;Soft tissue mobilization;Manual Traction    Manual therapy comments  MTPR along R upper trap/ levator scapuale, Scalenes   sub-occipital release   Joint Mobilization  C3-C7 R lateral gapping grade III, R first rib mobs grade III    Manual Traction  manual cervical traction x 5 min   noted relief of N/T in the arm     Neck Exercises: Stretches   Upper Trapezius Stretch  Right;2 reps;30 seconds  Levator Stretch  1 rep;Right;30 seconds             PT Education - 01/21/19 1047    Education Details  reviewed previously provided HEP    Person(s) Educated  Patient    Methods  Explanation;Verbal cues    Comprehension  Verbalized understanding;Verbal cues required          PT Long Term Goals - 01/12/19 0950      PT LONG TERM GOAL #1   Title  Pt will be I and compliant with HEP. (Target goal for all goals 6 weeks 02/16/19)    Baseline  no HEP until today    Status  New      PT LONG TERM GOAL #2   Title  Pt will improve neck ROM to [facility] at least 75% to improve functional mobility    Baseline  25% extension, 50% sidebending    Status  New      PT LONG TERM GOAL #3   Title  Pt will improve Rt shoulder and elbow strength to at least 5-/5 MMT to improve function    Baseline  4/5 ER, 4+/5 abd, 4/5 elbow extension    Status  New      PT LONG TERM GOAL #4   Title  Pt report overall 50% pain reduction with usual activity and sleeping    Baseline  8/10    Status  New            Plan - 01/21/19 1047    Clinical Impression Statement  pt reports continued pain / N/T in the  nec and into the R shoulder. Reviewed HEP which he reported consistency with. pt noted relief of N/T in the arm with cerivcal mobs and manual traction. worked on Manufacturing engineer. utilized e-stim with ice to calm down pain.    PT Next Visit Plan  review HEP,  consider modalaties for pain, needs shoulder stabilizaiton and strength, manual techniques, taping, cervical/shoulder mobs    PT Home Exercise Plan  chin tucks, UT stretch, cerv ext SNAG, rows, ext, ER with band,    Consulted and Agree with Plan of Care  Patient       Patient will benefit from skilled therapeutic intervention in order to improve the following deficits and impairments:  Decreased activity tolerance, Decreased range of motion, Decreased strength, Increased muscle spasms, Postural dysfunction, Pain  Visit Diagnosis: 1. Cervicalgia   2. Chronic right shoulder pain        Problem List Patient Active Problem List   Diagnosis Date Noted  . Tobacco use 12/30/2018  . Cervical radiculopathy 12/30/2018  . Alcohol use 12/30/2018   Starr Lake PT, DPT, LAT, ATC  01/21/19  10:50 AM      [facility] [address] [place], [place], [address] Phone: [phone]   Fax:  [phone]  Name: George Rocha MRN: [id number] Date of Birth: July 12, 1955       PHYSICAL THERAPY DISCHARGE SUMMARY  Visits from Start of Care: 2  Current functional level related to goals / functional outcomes: See goals   Remaining deficits: unknown   Education / Equipment: HEP  Plan: Patient agrees to discharge.  Patient goals were not met. Patient is being discharged due to not returning since the last visit.  ?????          Dolorez Jeffrey PT, DPT, LAT, ATC  03/29/19  9:49 AM

## 2019-01-27 ENCOUNTER — Ambulatory Visit: Payer: Medicaid Other | Admitting: Physical Therapy

## 2019-01-30 ENCOUNTER — Ambulatory Visit
Admission: RE | Admit: 2019-01-30 | Discharge: 2019-01-30 | Disposition: A | Discharge status: Home / Self Care | Payer: Medicaid Other | Source: Ambulatory Visit | Attending: Neurology | Admitting: Neurology

## 2019-01-30 ENCOUNTER — Other Ambulatory Visit: Payer: Self-pay

## 2019-01-30 DIAGNOSIS — R292 Abnormal reflex: Secondary | ICD-10-CM

## 2019-01-30 DIAGNOSIS — Z789 Other specified health status: Secondary | ICD-10-CM

## 2019-01-30 DIAGNOSIS — Z720 Tobacco use: Secondary | ICD-10-CM

## 2019-01-30 DIAGNOSIS — M5412 Radiculopathy, cervical region: Secondary | ICD-10-CM

## 2019-01-30 DIAGNOSIS — R29898 Other symptoms and signs involving the musculoskeletal system: Secondary | ICD-10-CM

## 2019-01-30 DIAGNOSIS — Z7289 Other problems related to lifestyle: Secondary | ICD-10-CM

## 2019-02-01 ENCOUNTER — Telehealth: Payer: Self-pay | Admitting: Neurology

## 2019-02-01 MED ORDER — DIAZEPAM 5 MG PO TABS: ORAL_TABLET | ORAL | 0 refills | Status: AC

## 2019-02-01 NOTE — Telephone Encounter (Signed)
Pt needs something to help relax during the MRI  Please call into the [facility] on [address]

## 2019-02-01 NOTE — Telephone Encounter (Signed)
Prescription for valium 5mg  sent to pharmacy.

## 2019-02-01 NOTE — Telephone Encounter (Signed)
Please see and advise on medciation for MRI

## 2019-02-04 ENCOUNTER — Encounter: Payer: Medicaid Other | Admitting: Physical Therapy

## 2019-02-05 ENCOUNTER — Other Ambulatory Visit: Payer: Medicaid Other

## 2019-03-02 ENCOUNTER — Other Ambulatory Visit: Payer: Medicaid Other

## 2019-09-17 IMAGING — CR DG CHEST 2V
2 series · 2 of 2 positions shown · non-contrast
Comparison: None.

CLINICAL DATA: Left-sided chest pain and shortness of breath for 2
months.

EXAM:
CHEST - 2 VIEW

[w chest pa]
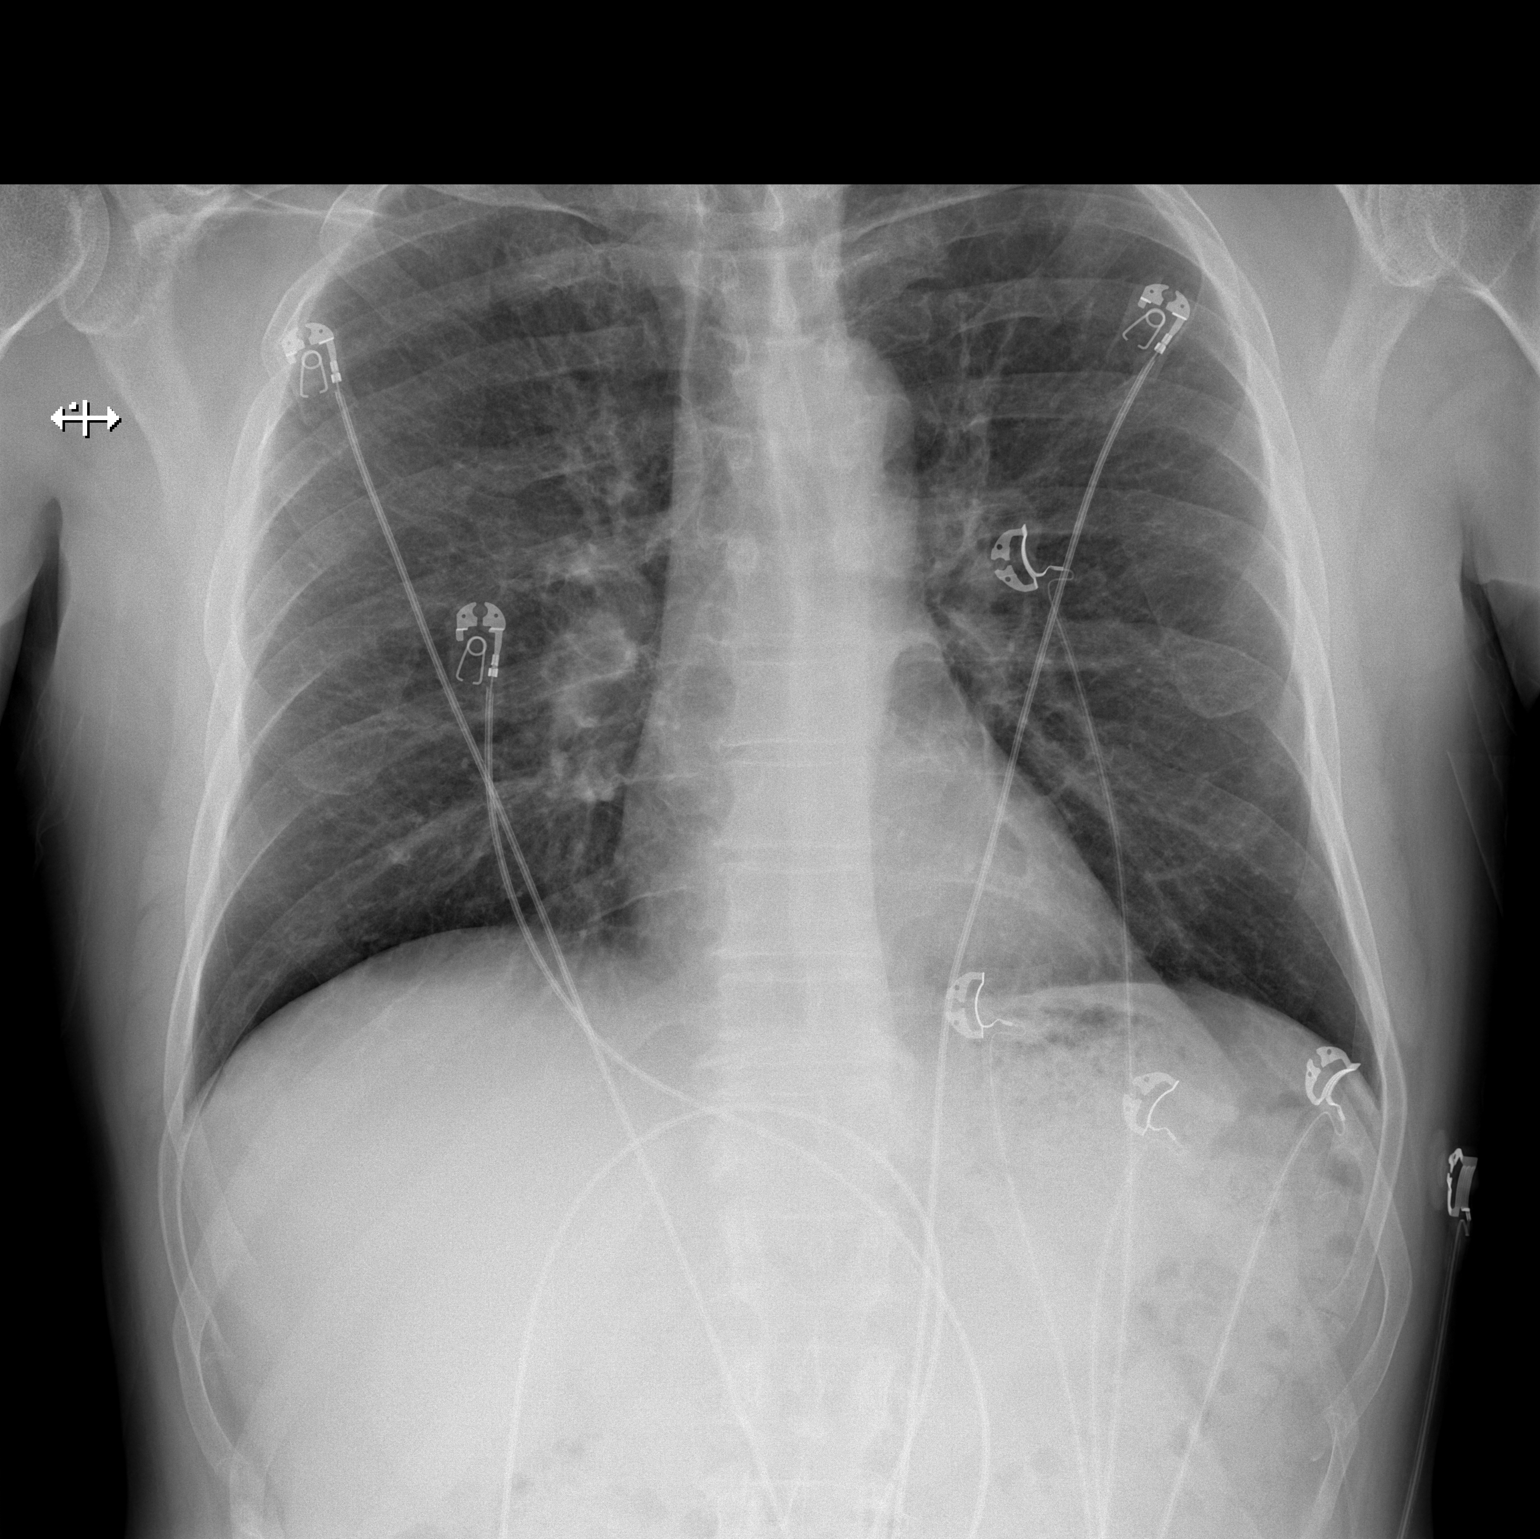

[w chest lat]
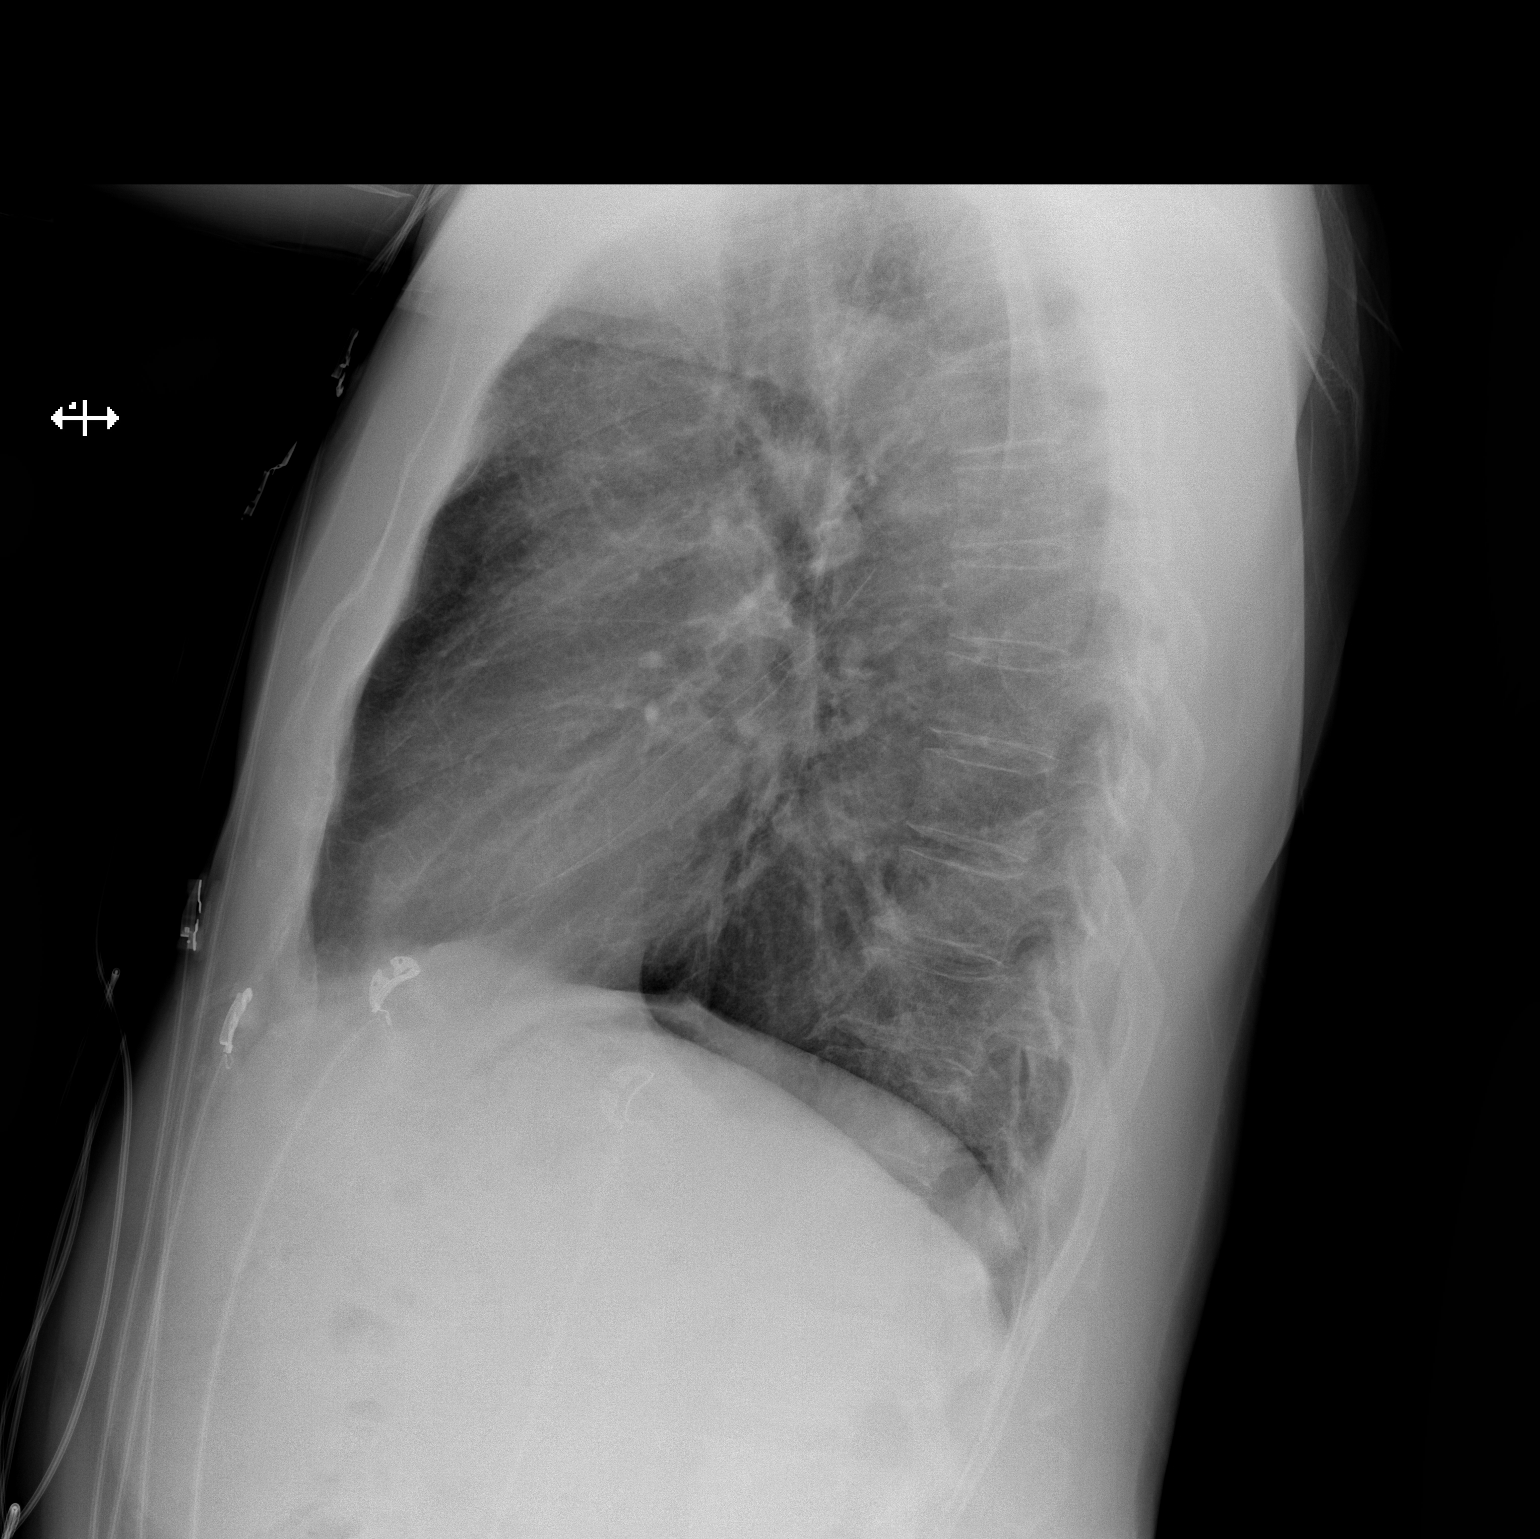

[2 of 2 positions shown; findings below may reference images not displayed]

FINDINGS: The heart size and mediastinal contours are within normal limits.
Both lungs are clear. No evidence of pneumothorax or pleural
effusion. The visualized skeletal structures are unremarkable.
IMPRESSION: No active cardiopulmonary disease.

## 2019-09-23 ENCOUNTER — Ambulatory Visit: Payer: Medicaid Other | Attending: Internal Medicine

## 2019-09-23 DIAGNOSIS — Z23 Encounter for immunization: Secondary | ICD-10-CM

## 2019-09-23 NOTE — Progress Notes (Signed)
   Covid-19 Vaccination Clinic  Name:  Carel Carrier    MRN: [id number] DOB: 1956-01-18  09/23/2019  Mr. Fofana was observed post Covid-19 immunization for 15 minutes without incident. He was provided with Vaccine Information Sheet and instruction to access the V-Safe system.   Mr. Giammarco was instructed to call 911 with any severe reactions post vaccine: Marland Kitchen Difficulty breathing  . Swelling of face and throat  . A fast heartbeat  . A bad rash all over body  . Dizziness and weakness   Immunizations Administered    Name Date Dose VIS Date Route   Pfizer COVID-19 Vaccine 09/23/2019  4:11 PM 0.3 mL 05/28/2019 Intramuscular   Manufacturer: [employer], [employer]   Lot: [id number]   NDC: [address]-5

## 2019-10-18 ENCOUNTER — Ambulatory Visit: Payer: Medicaid Other | Attending: Internal Medicine

## 2019-10-18 DIAGNOSIS — Z23 Encounter for immunization: Secondary | ICD-10-CM

## 2019-10-18 NOTE — Progress Notes (Signed)
   Covid-19 Vaccination Clinic  Name:  George Rocha    MRN: [id number] DOB: [birth year]  10/18/2019  [name] was observed post Covid-19 immunization for 15 minutes without incident. He was provided with Vaccine Information Sheet and instruction to access the V-Safe system.   [name] was instructed to call 911 with any severe reactions post vaccine: Marland Kitchen Difficulty breathing  . Swelling of face and throat  . A fast heartbeat  . A bad rash all over body  . Dizziness and weakness   Immunizations Administered    Name Date Dose VIS Date Route   Pfizer COVID-19 Vaccine 10/18/2019 10:44 AM 0.3 mL 08/11/2018 Intramuscular   Manufacturer: [employer], [employer]   Lot: [id number]   NDC: [address]-6

## 2022-04-13 ENCOUNTER — Emergency Department (HOSPITAL_COMMUNITY): Payer: Medicare HMO

## 2022-04-13 ENCOUNTER — Other Ambulatory Visit: Payer: Self-pay

## 2022-04-13 ENCOUNTER — Emergency Department (HOSPITAL_COMMUNITY)
Admission: EM | Admit: 2022-04-13 | Discharge: 2022-04-13 | Disposition: A | Discharge status: Home / Self Care | Payer: Medicare HMO | Attending: Emergency Medicine | Admitting: Emergency Medicine

## 2022-04-13 ENCOUNTER — Encounter (HOSPITAL_COMMUNITY): Payer: Self-pay

## 2022-04-13 DIAGNOSIS — M79642 Pain in left hand: Secondary | ICD-10-CM | POA: Diagnosis present

## 2022-04-13 DIAGNOSIS — R600 Localized edema: Secondary | ICD-10-CM | POA: Diagnosis not present

## 2022-04-13 DIAGNOSIS — M7989 Other specified soft tissue disorders: Secondary | ICD-10-CM | POA: Diagnosis not present

## 2022-04-13 DIAGNOSIS — L03114 Cellulitis of left upper limb: Secondary | ICD-10-CM | POA: Diagnosis not present

## 2022-04-13 LAB — BASIC METABOLIC PANEL
Anion gap: 9 (ref 5–15)
BUN: 7 mg/dL — ABNORMAL LOW (ref 8–23)
CO2: 23 mmol/L (ref 22–32)
Calcium: 9.1 mg/dL (ref 8.9–10.3)
Chloride: 103 mmol/L (ref 98–111)
Creatinine, Ser: 1 mg/dL (ref 0.61–1.24)
GFR, Estimated: >60 mL/min (ref >60)
Glucose, Bld: 87 mg/dL (ref 70–99)
Potassium: 3.7 mmol/L (ref 3.5–5.1)
Sodium: 135 mmol/L (ref 135–145)

## 2022-04-13 LAB — CBC WITH DIFFERENTIAL/PLATELET
Abs Immature Granulocytes: 0.04 10*3/uL (ref 0.00–0.07)
Basophils Absolute: 0 10*3/uL (ref 0.0–0.1)
Basophils Relative: 0 %
Eosinophils Absolute: 0.1 10*3/uL (ref 0.0–0.5)
Eosinophils Relative: 1 %
HCT: 45.7 % (ref 39.0–52.0)
Hemoglobin: 14.8 g/dL (ref 13.0–17.0)
Immature Granulocytes: 0 %
Lymphocytes Relative: 14 %
Lymphs Abs: 1.8 10*3/uL (ref 0.7–4.0)
MCH: 28.7 pg (ref 26.0–34.0)
MCHC: 32.4 g/dL (ref 30.0–36.0)
MCV: 88.7 fL (ref 80.0–100.0)
Monocytes Absolute: 1.1 10*3/uL — ABNORMAL HIGH (ref 0.1–1.0)
Monocytes Relative: 9 %
Neutro Abs: 9.9 10*3/uL — ABNORMAL HIGH (ref 1.7–7.7)
Neutrophils Relative %: 76 %
Platelets: 329 10*3/uL (ref 150–400)
RBC: 5.15 MIL/uL (ref 4.22–5.81)
RDW: 14.6 % (ref 11.5–15.5)
WBC: 13 10*3/uL — ABNORMAL HIGH (ref 4.0–10.5)
nRBC: 0 % (ref 0.0–0.2)

## 2022-04-13 MED ORDER — CEPHALEXIN 500 MG PO CAPS: 500.0000 mg | ORAL_CAPSULE | Freq: Four times a day (QID) | ORAL | 0 refills | Status: AC

## 2022-04-13 MED ORDER — NAPROXEN 500 MG PO TABS: 500.0000 mg | ORAL_TABLET | Freq: Two times a day (BID) | ORAL | 0 refills | Status: AC

## 2022-04-13 MED ORDER — OXYCODONE-ACETAMINOPHEN 5-325 MG PO TABS
1.0000 | ORAL_TABLET | Freq: Once | ORAL | Status: AC
  Administered 2022-04-13: 1 via ORAL
  Filled 2022-04-13: qty 1

## 2022-04-13 NOTE — ED Provider Triage Note (Signed)
Emergency Medicine Provider Triage Evaluation Note  George Rocha , Rocha 66 y.o. male  was evaluated in triage.  Pt complains of left hand swelling onset 3 days.  Denies recent injury, trauma, fall. Denies history of gout.  Denies recent insect bite or punching Rocha wall.  No meds tried prior to arrival.  Review of Systems  Positive:  Negative:   Physical Exam  BP 131/80 (BP Location: Right Arm)   Pulse 85   Temp 98.1 F (36.7 C) (Oral)   Resp 15   Ht 6' 1.5" (1.867 m)   Wt 77.1 kg   SpO2 99%   BMI 22.12 kg/m  Gen:   Awake, no distress   Resp:  Normal effort  MSK:   Moves extremities without difficulty  Other:  Swelling noted to left hand with decreased range of motion of left wrist secondary to pain.  Tenderness to palpation noted to the area.  Radial pulse intact.  Medical Decision Making  Medically screening exam initiated at 1:17 PM.  Appropriate orders placed.  George Rocha was informed that the remainder of the evaluation will be completed by another provider, this initial triage assessment does not replace that evaluation, and the importance of remaining in the ED until their evaluation is complete.  Work-up initiated   George Mcneff A, PA-C 04/13/22 1318

## 2022-04-13 NOTE — ED Triage Notes (Signed)
Patient said he woke up Thursday morning and his left hand was swollen. Denies drug use, or being bit by anything. Patient able to move hand and still has sensation.

## 2022-04-13 NOTE — ED Provider Notes (Signed)
[facility]-EMERGENCY DEPT Provider Note   CSN: [id number] Arrival date & time: 04/13/22  1302     History  Chief Complaint  Patient presents with   Swollen Hand    George Rocha is a 66 y.o. male who presents to the ED complaining of left hand pain/swelling onset 3 days. Denies recent injury, trauma, fall. Denies history of gout.  Denies recent insect bite or punching a wall.  No meds tried prior to arrival. Denies wrist pain.   The history is provided by the patient. No language interpreter was used.       Home Medications Prior to Admission medications   Medication Sig Start Date End Date Taking? Authorizing Provider  cephALEXin (KEFLEX) 500 MG capsule Take 1 capsule (500 mg total) by mouth 4 (four) times daily. 04/13/22  Yes Lanetta Figuero A, PA-C  naproxen (NAPROSYN) 500 MG tablet Take 1 tablet (500 mg total) by mouth 2 (two) times daily. 04/13/22  Yes Jiselle Sheu A, PA-C  cyclobenzaprine (FLEXERIL) 5 MG tablet Take 1 tablet (5 mg total) by mouth at bedtime. 12/30/18   Patel, Roxana Hires K, DO  diazepam (VALIUM) 5 MG tablet Take 1 tablet 30-min prior to MRI. 02/01/19   Nita Sickle K, DO  gabapentin (NEURONTIN) 300 MG capsule Take 1 tablet at bedtime for 1 week, then increase to 1 tablet twice daily 12/30/18   Nita Sickle K, DO      Allergies    Patient has no known allergies.    Review of Systems   Review of Systems  All other systems reviewed and are negative.   Physical Exam Updated Vital Signs BP 131/80 (BP Location: Right Arm)   Pulse 85   Temp 98.1 F (36.7 C) (Oral)   Resp 15   Ht 6' 1.5" (1.867 m)   Wt 77.1 kg   SpO2 99%   BMI 22.12 kg/m  Physical Exam Vitals and nursing note reviewed.  Constitutional:      General: He is not in acute distress.    Appearance: Normal appearance.  Eyes:     General: No scleral icterus.    Extraocular Movements: Extraocular movements intact.  Cardiovascular:     Rate and Rhythm: Normal rate.   Pulmonary:     Effort: Pulmonary effort is normal. No respiratory distress.  Abdominal:     Palpations: Abdomen is soft. There is no mass.     Tenderness: There is no abdominal tenderness.  Musculoskeletal:        General: Normal range of motion.     Cervical back: Neck supple.     Comments: Swelling noted to left hand with decreased range of motion of left wrist secondary to pain.  Tenderness to palpation noted to the area.  Radial pulse intact.  Skin:    General: Skin is warm and dry.     Findings: No rash.  Neurological:     Mental Status: He is alert.     Sensory: Sensation is intact.     Motor: Motor function is intact.  Psychiatric:        Behavior: Behavior normal.     ED Results / Procedures / Treatments   Labs (all labs ordered are listed, but only abnormal results are displayed) Labs Reviewed  BASIC METABOLIC PANEL - Abnormal; Notable for the following components:      Result Value   BUN 7 (*)    All other components within normal limits  CBC WITH DIFFERENTIAL/PLATELET - Abnormal; Notable  for the following components:   WBC 13.0 (*)    Neutro Abs 9.9 (*)    Monocytes Absolute 1.1 (*)    All other components within normal limits    EKG None  Radiology DG Hand Complete Left  Result Date: 04/13/2022 CLINICAL DATA:  Left hand swelling, no known injury EXAM: LEFT HAND - COMPLETE 3+ VIEW COMPARISON:  None Available. FINDINGS: No fracture or dislocation of the left hand. 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 Diffuse soft tissue edema about the hand. IMPRESSION: 1. No fracture or dislocation of the left hand. 2. 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 3. Diffuse soft tissue edema about the hand. Electronically Signed   By: Jearld Lesch M.D.   On:  04/13/2022 13:50    Procedures Procedures    Medications Ordered in ED Medications  oxyCODONE-acetaminophen (PERCOCET/ROXICET) 5-325 MG per tablet 1 tablet (1 tablet Oral Given 04/13/22 1542)    ED Course/ Medical Decision Making/ A&P Clinical Course as of 04/13/22 1544  Sat Apr 13, 2022  1519 Discussed with patient discharge treatment plan.  Answered all available questions.  Patient appears safe for discharge at this time. [SB]    Clinical Course User Index [SB] Aprill Banko A, PA-C                           Medical Decision Making Amount and/or Complexity of Data Reviewed Labs: ordered. Radiology: ordered.  Risk Prescription drug management.   Patient with left hand swelling/pain onset 3 days.  No recent injury, trauma, fall.  No history of gout.  No recent insect bite or puncture of the wall.  No meds tried prior to arrival.  Patient afebrile.  On exam patient with swelling noted to left hand with decreased range of motion of left wrist secondary to pain.  Tenderness to palpation noted to the area.  Radial pulse intact. Differential diagnosis includes fracture, dislocation, sprain, gout, septic arthritis, OA, cellulitis, abscess.    Imaging: I ordered imaging studies including left hand xray I independently visualized and interpreted imaging which showed:  1. No fracture or dislocation of the left hand.  2. Generally mild carpal arthrosis with otherwise preserved joint  spaces and no clear radiographic evidence of bony erosion or  sclerosis suggest inflammatory arthropathy such as gout.  3. Diffuse soft tissue edema about the hand.   I agree with the radiologist interpretation  Medications:  I ordered medication including percocet for pain management  I have reviewed the patients home medicines and have made adjustments as needed   Disposition: Presentation suspicious for cellulitis. Doubt gout or abscess at this time. Doubt fracture or dislocation. Patient  afebrile with vital signs within normal limits, this is less likely septic arthritis at this time. Less likely osteoarthritis due to no radiographic findings. After consideration of the diagnostic results and the patients response to treatment, I feel that the patient would benefit from Discharge home.   Keflex and Naprosyn prescriptions sent. Supportive care measures and strict return precautions discussed with patient at bedside. Pt acknowledges and verbalizes understanding. Pt appears safe for discharge. Follow up as indicated in discharge paperwork.    This chart was dictated using voice recognition software, Dragon. Despite the best efforts of this provider to proofread and correct errors,  errors may still occur which can change documentation meaning.  Final Clinical Impression(s) / ED Diagnoses Final diagnoses:  Cellulitis of left hand    Rx / DC Orders ED Discharge Orders          Ordered    cephALEXin (KEFLEX) 500 MG capsule  4 times daily        04/13/22 1531    naproxen (NAPROSYN) 500 MG tablet  2 times daily        04/13/22 1531              Derec Mozingo A, PA-C 04/13/22 1544    Regan Lemming, MD 04/13/22 1847

## 2022-04-13 NOTE — Discharge Instructions (Addendum)
It was a pleasure taking care of you today!  Your x-ray did not show any concerning findings at this time.  Recent prescription for Keflex (antibiotic) as well as Naprosyn, take these medications as prescribed.  Please ice to the affected area up to 15 minutes at a time, sure to place a barrier between your skin and ice.  Return to the emergency department for experiencing increasing/worsening symptoms.

## 2022-08-21 ENCOUNTER — Other Ambulatory Visit (HOSPITAL_COMMUNITY)
Admission: EM | Admit: 2022-08-21 | Discharge: 2022-08-21 | Disposition: A | Discharge status: Home / Self Care | Payer: Medicare PPO | Attending: Psychiatry | Admitting: Psychiatry

## 2022-08-21 DIAGNOSIS — F1020 Alcohol dependence, uncomplicated: Secondary | ICD-10-CM | POA: Insufficient documentation

## 2022-08-21 DIAGNOSIS — F1090 Alcohol use, unspecified, uncomplicated: Secondary | ICD-10-CM | POA: Diagnosis not present

## 2022-08-21 MED ORDER — ACETAMINOPHEN 325 MG PO TABS: 650.0000 mg | ORAL_TABLET | Freq: Four times a day (QID) | ORAL | Status: DC | PRN

## 2022-08-21 MED ORDER — LOPERAMIDE HCL 2 MG PO CAPS: 2.0000 mg | ORAL_CAPSULE | ORAL | Status: DC | PRN

## 2022-08-21 MED ORDER — LORAZEPAM 1 MG PO TABS: 1.0000 mg | ORAL_TABLET | Freq: Two times a day (BID) | ORAL | Status: DC

## 2022-08-21 MED ORDER — LORAZEPAM 1 MG PO TABS: 1.0000 mg | ORAL_TABLET | Freq: Every day | ORAL | Status: DC

## 2022-08-21 MED ORDER — THIAMINE MONONITRATE 100 MG PO TABS: 100.0000 mg | ORAL_TABLET | Freq: Every day | ORAL | Status: DC

## 2022-08-21 MED ORDER — ADULT MULTIVITAMIN W/MINERALS CH: 1.0000 | ORAL_TABLET | Freq: Every day | ORAL | Status: DC

## 2022-08-21 MED ORDER — LORAZEPAM 1 MG PO TABS: 1.0000 mg | ORAL_TABLET | Freq: Three times a day (TID) | ORAL | Status: DC

## 2022-08-21 MED ORDER — TRAZODONE HCL 50 MG PO TABS: 50.0000 mg | ORAL_TABLET | Freq: Every evening | ORAL | Status: DC | PRN

## 2022-08-21 MED ORDER — ALUM & MAG HYDROXIDE-SIMETH 200-200-20 MG/5ML PO SUSP: 30.0000 mL | ORAL | Status: DC | PRN

## 2022-08-21 MED ORDER — LORAZEPAM 1 MG PO TABS: 1.0000 mg | ORAL_TABLET | Freq: Four times a day (QID) | ORAL | Status: DC | PRN

## 2022-08-21 MED ORDER — ONDANSETRON 4 MG PO TBDP: 4.0000 mg | ORAL_TABLET | Freq: Four times a day (QID) | ORAL | Status: DC | PRN

## 2022-08-21 MED ORDER — MAGNESIUM HYDROXIDE 400 MG/5ML PO SUSP: 30.0000 mL | Freq: Every day | ORAL | Status: DC | PRN

## 2022-08-21 MED ORDER — LORAZEPAM 1 MG PO TABS: 1.0000 mg | ORAL_TABLET | Freq: Four times a day (QID) | ORAL | Status: DC

## 2022-08-21 MED ORDER — THIAMINE HCL 100 MG/ML IJ SOLN: 100.0000 mg | Freq: Once | INTRAMUSCULAR | Status: DC

## 2022-08-21 MED ORDER — NICOTINE 14 MG/24HR TD PT24: 14.0000 mg | MEDICATED_PATCH | Freq: Every day | TRANSDERMAL | Status: DC

## 2022-08-21 NOTE — ED Provider Notes (Signed)
Behavioral Health Urgent Care Medical Screening Exam  Patient Name: George Rocha MRN: [id number] Date of Evaluation: 08/21/22 Chief Complaint:   Diagnosis:  Final diagnoses:  Alcohol use disorder    History of Present illness: George Rocha is a 67 y.o. male. Patient presents voluntarily to [facility] behavioral health for walk-in assessment.  Patient is assessed, face-to-face, by nurse practitioner. He is seated in assessment area, no acute distress. Consulted with provider, Dr. Dwyane Dee, and chart reviewed on 08/21/2022. He  is alert and oriented, pleasant and cooperative during assessment. Patient prefers to be called "[employer] states "I drink too much.  It is time for me to get myself together, I need to stop drinking and I cannot do it by myself.  I need some help."  Patient's alcohol use that began at age 14.  He uses alcohol daily.  Consumes an average of fifteen 12 ounce beers per day.  He last used alcohol approximately 2 hours ago.  He denies history of blackout, denies history of alcohol-related seizure.  He denies history of delirium tremens.  No history of residential substance use treatment.  He would like to seek residential substance treatment moving forward.  He states "I would like to be in a 36-monthor 1 year-long program."  Patient did attend AA meetings approximately 30 years ago.  No significant sober time at that point.  He denies substance use aside from alcohol and daily nicotine cigarettes.  Patient reports recent stressors include the loss of his 473year old son last week.  His 429year old daughter passed away several years ago.  Patient states "all of my kids are gone now, it is time that I get myself together."  Patient is not linked with outpatient psychiatry, denies mental health diagnoses.  He denies history of inpatient psychiatric hospitalization.  No family mental health history reported. Patient  presents with euthymic mood, congruent affect. he  denies  suicidal and homicidal ideations. Denies history of suicide attempts, denies history of self-harm.  Patient easily  contracts verbally for safety with this wProbation officer    Patient has normal speech and behavior. He  denies auditory and visual hallucinations.  Patient is able to converse coherently with goal-directed thoughts and no distractibility or preoccupation.  Denies symptoms of paranoia.  Objectively there is no evidence of psychosis/mania or delusional thinking.  [place]  He is employed in the [employer]  Patient endorses average sleep and appetite.  Patient offered admission to facility based crisis unit.  Initially patient agreed.  Patient has dentist appointment as well as primary care appointment upcoming early next week.  Patient will follow-up with substance use treatment resources provided.   Patient  educated and verbalizes understanding of mental health resources and other crisis services in the community. They are instructed to call 911 and present to the nearest emergency room should patient experience any suicidal/homicidal ideation, auditory/visual/hallucinations, or detrimental worsening of mental health condition.     [facility] [place]       Psychiatric Specialty Exam  Presentation  General Appearance:Appropriate for Environment; Casual  Eye Contact:Good  Speech:Clear and Coherent; Normal Rate  Speech Volume:Normal  Handedness:Right   Mood and Affect  Mood: Euthymic  Affect: Appropriate; Congruent   Thought Process  Thought Processes: Coherent; Goal Directed; Linear  Descriptions of Associations:Intact  Orientation:Full (Time, Place and Person)  Thought Content:Logical; WDL  Diagnosis of Schizophrenia or Schizoaffective disorder in  past: No   Hallucinations:None  Ideas of Reference:None  Suicidal Thoughts:No  Homicidal Thoughts:No   Sensorium  Memory: Immediate Good; Recent Good  Judgment: Fair  Insight: Fair   Community education officer  Concentration: Good  Attention Span: Good  Recall: Good  Fund of Knowledge: Good  Language: Good   Psychomotor Activity  Psychomotor Activity: Normal   Assets  Assets: Communication Skills; Desire for Improvement; Financial Resources/Insurance; Housing; Physical Health; Resilience; Social Support   Sleep  Sleep: Good  Number of hours: No data recorded  Physical Exam: Physical Exam Vitals and nursing note reviewed.  Constitutional:      Appearance: Normal appearance. He is well-developed and normal weight.  HENT:     Head: Normocephalic and atraumatic.     Nose: Nose normal.  Cardiovascular:     Rate and Rhythm: Normal rate.  Pulmonary:     Effort: Pulmonary effort is normal.  Musculoskeletal:        General: Normal range of motion.     Cervical back: Normal range of motion.  Skin:    General: Skin is warm and dry.  Neurological:     Mental Status: He is alert and oriented to person, place, and time.  Psychiatric:        Attention and Perception: Attention and perception normal.        Mood and Affect: Mood and affect normal.        Speech: Speech normal.        Behavior: Behavior normal. Behavior is cooperative.        Thought Content: Thought content normal.        Cognition and Memory: Cognition and memory normal.        Judgment: Judgment normal.    Review of Systems  Constitutional: Negative.   HENT: Negative.    Eyes: Negative.   Respiratory: Negative.    Cardiovascular: Negative.   Gastrointestinal: Negative.   Genitourinary: Negative.   Musculoskeletal: Negative.   Skin: Negative.   Neurological: Negative.   Psychiatric/Behavioral:  Positive for substance abuse.    Blood pressure 97/66, pulse 80, temperature 98 F  (36.7 C), temperature source Oral, resp. rate 18, SpO2 100 %. There is no height or weight on file to calculate BMI.  Musculoskeletal: Strength & Muscle Tone: within normal limits Gait & Station: normal Patient leans: N/A   [place] [facility] Discharge Disposition for Follow up and Recommendations: Based on my evaluation the patient does not appear to have an emergency medical condition and can be discharged with resources and follow up care in outpatient services for Individual Therapy Follow up with outpatient psychiatry, resources provided.  Follow up with substance use treatment provided.   Lucky Rathke, FNP 08/21/2022, 4:49 PM

## 2022-08-21 NOTE — Discharge Instructions (Addendum)
Patient is instructed prior to discharge to:  Take all medications as prescribed by his/her mental healthcare provider. Report any adverse effects and or reactions from the medicines to his/her outpatient provider promptly. Keep all scheduled appointments, to ensure that you are getting refills on time and to avoid any interruption in your medication.  If you are unable to keep an appointment call to reschedule.  Be sure to follow-up with resources and follow-up appointments provided.  Patient has been instructed & cautioned: To not engage in alcohol and or illegal drug use while on prescription medicines. In the event of worsening symptoms, patient is instructed to call the crisis hotline, 911 and or go to the nearest ED for appropriate evaluation and treatment of symptoms. To follow-up with his/her primary care provider for your other medical issues, concerns and or health care needs.  Information: -National Suicide Prevention Lifeline 1-800-SUICIDE or 226-538-7606.  -988 offers 24/7 access to trained crisis counselors who can help people experiencing mental health-related distress. People can call or text 988 or chat 988lifeline.org for themselves or if they are worried about a loved one who may need crisis support.      Substance Abuse Treatment Resources listed Below:  [facility] - Admissions are currently completed Monday through Friday at [facility]; both appointments and walk-ins are accepted.  Any individual that is a [facility] resident may present for a substance abuse screening and assessment for admission.  A person may be referred by numerous sources or self-refer.   Potential clients will be screened for medical necessity and appropriateness for the program.  Clients must meet criteria for high-intensity residential treatment services.  If clinically appropriate, a client will continue with the comprehensive clinical assessment and intake process, as well  as enrollment in the [place].  Address: [address] [place], [place] [address] Admin Hours: [place] Caul to [place] Hours: 24/7 Phone: [phone] Fax: [place] Address: [place], [place], [place] [address] Behavioral Health Urgent Care [facility]) Hours: 24/7 Phone: [phone] Fax: [phone]  [facility] ([facility]): (offers outpatient therapy and intensive outpatient substance abuse therapy).  [address] [place], [place] [address] Phone: [phone]  [place]: Offers FREE recovery skills classes, support groups, 1:1 Peer Support, and Compeer Classes. [address], [place], [place] [address] Phone: [phone] (Call to complete intake).   [facility] [place], [place] [address] Phone: [phone] ext [phone] The [facility] provides food, shelter and other programs and services to the homeless men of [place] through our [employer] program.  By offering safe shelter, three meals a day, clean clothing, Biblical counseling, financial planning, vocational training, GED/education and employment assistance, we've helped mend the shattered lives of many homeless men since opening in 1974.  We have approximately 267 beds available, with a max of 312 beds including mats for emergency situations and currently house an average of 270 men a night.  Prospective Client Check-In Information Photo ID Required (State/ Out of State/ [facility]) - if photo ID is not available, clients are required to have a printout of a police/sheriff's criminal history report. Help out with chores around the [place]. No sex offender of any type (pending, charged, registered and/or any other sex related offenses) will be permitted to check in. Must be willing to abide by all rules, regulations, and policies established by the [employer]. The  following  will be provided - shelter, food, clothing, and biblical counseling. If you or someone you know is in need of assistance at our [facility] shelter in [place], [place], please call [phone] ext. WW:2075573.  [facility]-will provide timely access to mental health services for children and adolescents (4-17) and adults presenting in a mental health crisis. The program is designed for those who need urgent Behavioral Health or Substance Use treatment and are not experiencing a medical crisis that would typically require an emergency room visit.    [place], [place] [address] Phone: [phone] [place]: Phone#: [phone]  The [facility] SA Intensive Outpatient Program (SAIOP) means structured individual and group addiction activities and services that are provided at an outpatient program designed to assist adult and adolescent consumers to begin recovery and learn skills for recovery maintenance. The [place] program is offered at least 3 hours a day, 3 days a week.SAIOP services shall include a structured program consisting of, but not limited to, the following services: Individual counseling and support; Group counseling and support; Family counseling, training or support; Biochemical assays to identify recent drug use (e.g., urine drug screens); Strategies for relapse prevention to include community and social support systems in treatment; Life skills; Crisis contingency planning; Disease Management; and Treatment support activities that have been adapted or specifically designed for persons with physical disabilities, or persons with co-occurring disorders of mental illness and substance abuse/dependence or mental retardation/developmental disability and substance abuse/dependence. Phone: [phone]  Address:   The [facility] will also offer the following outpatient  services: (Monday through Friday 8am-5pm)   Partial Hospitalization Program (PHP) Substance Abuse Intensive Outpatient Program (SA-IOP) Group Therapy Medication Management Peer Living Room We also provide (24/7):  Assessments: Our mental health clinician and providers will conduct a focused mental health evaluation, assessing for immediate safety concerns and further mental health needs. Referral: Our team will provide resources and help connect to community based mental health treatment, when indicated, including psychotherapy, psychiatry, and other specialized behavioral health or substance use disorder services (for those not already in treatment). Transitional Care: Our team providers in person bridging and/or telephonic follow-up during the patient's transition to outpatient services.  The [place]: [phone] [place]: [phone]  Outpatient Therapy for Medicare Recipients:  [facility] [address] [place], [place], [address] [phone] phone  [address] [place], [place], [address] [phone] phone ([address], [place], [place], [place], [place], [employer], [place], [place], [place], [place], [place], [place], [place], [place], [place], [employer], Startup)  [facility] [address] [place], [place], [address] [phone] phone  [facility] [address] [place], [place], [address] [phone] phone  [address] [place], [place], [address] [phone] phone [phone] fax  [facility], [place] [address], [place], [address] [phone] phone  [address] [place], [place], [address] [phone] phone [phone]  fax  [facility] [address] [place], [place], [address] [phone] phone  Jinny Blossom [address] [place], [place], [address] [phone] phone  [facility] [employer] [employer]. [place], [place], [address] [phone] phone [phone] fax

## 2022-08-21 NOTE — Progress Notes (Signed)
   08/21/22 1557  [place] (Walk-ins at [facility] only)  How Did You Hear About [place]? Self  What Is the Reason for Your Visit/Call Today? Pt is a 67 yo male who presented voluntarily and unaccompanied seeking help with his drinking (alcohol). Pt denied SI, HI, NSSH, AVH, paranoia and any other substance use. Pt denied any past IP psychiatric admissions and any suicide attempts. Pt reported he drinks up to a 15 pack of beers daily.  How Long Has This Been Causing You Problems? > than 6 months  Have You Recently Had Any Thoughts About Hurting Yourself? No  Are You Planning to Commit Suicide/Harm Yourself At This time? No  Have you Recently Had Thoughts About [place]? No  Are You Planning To Harm Someone At This Time? No  Are you currently experiencing any auditory, visual or other hallucinations? No  Have You Used Any Alcohol or Drugs in the Past 24 Hours? Yes  How long ago did you use Drugs or Alcohol? 2 hours ago, alcohol  What Did You Use and How Much? unknnown amount  Do you have any current medical co-morbidities that require immediate attention? No (none reported)  Clinician description of patient physical appearance/behavior: Pt is seemed alert and oriented and did not appear to be responding to internal stimuli. Pt appeared casually dressed and was a bit disheveled. Pt's movement was slow but within normal limits. Pt's judgment and insight seemed fair.  What Do You Feel Would Help You the Most Today? Alcohol or Drug Use Treatment  If access to [facility] was not available, would you have sought care in the Emergency Department? Yes  Determination of Need Urgent (48 hours)  Options For Referral Facility-Based Crisis   Armonie Mettler T. Mare Ferrari, [place], [facility], [facility] Triage Specialist [facility]

## 2022-08-21 NOTE — BH Assessment (Addendum)
Comprehensive Clinical Assessment (CCA) Note  08/21/2022 Brigitte Pulse [id number]  DISPOSITION: (Patient left AMA)  The patient demonstrates the following risk factors for suicide: Chronic risk factors for suicide include: substance use disorder. Acute risk factors for suicide include: N/A. Protective factors for this patient include: positive social support, responsibility to others (children, family), and hope for the future. Considering these factors, the overall suicide risk at this point appears to be low. Patient is appropriate for outpatient follow up.    Pt is a 67 yo male who presented voluntarily and unaccompanied seeking help with his drinking (alcohol). Pt denied SI, HI, NSSH, AVH, paranoia and any other substance use. Pt denied any past IP psychiatric admissions and any suicide attempts. Pt reported he drinks up to a 15 pack of beers daily.  Pt reported he has been living with his mother but she is moving to [place]. Pt has moved in with his brother for now. Pt has had his 2 adult children killed or die within the last 4 months and seems to be grieving. 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   Pt seemed alert and oriented and did not appear to be responding to internal stimuli. Pt appeared casualy dressed and was a little bit disheveled. Pt's movement was slow but within normal limits. Pt's judgment and insight seemed fair to good    Chief Complaint: No chief complaint on file.  Visit Diagnosis:  Alcohol Use d/o, Severe MDD, Single Episode, moderate    CCA Screening, Triage and Referral (STR)  Patient Reported Information How did you hear about [place]? Self  What Is the Reason for Your Visit/Call Today? Pt is a 67 yo male who presented voluntarily and unaccompanied seeking help with his drinking (alcohol). Pt denied SI, HI, NSSH, AVH, paranoia and any other substance use. Pt denied any past IP psychiatric admissions and any suicide attempts. Pt reported he drinks up to a 15  pack of beers daily.  How Long Has This Been Causing You Problems? > than 6 months  What Do You Feel Would Help You the Most Today? Alcohol or Drug Use Treatment   Have You Recently Had Any Thoughts About Hurting Yourself? No  Are You Planning to Commit Suicide/Harm Yourself At This time? No   Flowsheet Row ED from 08[facility]21/2022 in [facility] ED from 04/13/2022 in [facility] Emergency Department at [place] No Risk No Risk       Have you Recently Had Thoughts About [place]? No  Are You Planning to Harm Someone at This Time? No  Explanation: na  Have You Used Any Alcohol or Drugs in the Past 24 Hours? Yes  What Did You Use and How Much? unknnown amount   Do You Currently Have a Therapist/Psychiatrist? No  Name of Therapist/Psychiatrist: Name of Therapist/Psychiatrist: na   Have You Been Recently Discharged From Any Office Practice or Programs? No  Explanation of Discharge From Practice/Program: na     CCA Screening Triage Referral Assessment Type of Contact: Face-to-Face  Telemedicine Service Delivery:   Is this Initial or Reassessment?   Date Telepsych consult ordered in [facility]:    Time Telepsych consult ordered in [facility]:    Location of Assessment: [facility] Services  Provider Location: [facility] Assessment Services   Collateral Involvement: none   Does Patient Have a [place]? No  Legal Guardian Contact Information: na  Copy of Legal Guardianship Form: No -  copy requested  Legal Guardian Notified of Arrival: -- (na)  Legal Guardian Notified of Pending Discharge: -- (na)  If Minor and Not Living with Parent(s), Who has Custody? adult  Is CPS involved or ever been involved? Never (none reported)  Is APS involved or ever been involved? Never (none reported)   Patient Determined To Be At Risk for Harm To Self or Others Based on Review of Patient Reported  Information or Presenting Complaint? No  Method: No Plan  Availability of Means: No access or NA  Intent: Vague intent or NA  Notification Required: No need or identified person  Additional Information for Danger to Others Potential: -- (na)  Additional Comments for Danger to Others Potential: na  Are There Guns or Other Weapons in Your Home? No (denied)  Types of Guns/Weapons: na  Are These Weapons Safely Secured?                            -- (na)  Who Could Verify You Are Able To Have These Secured: na  Do You Have any Outstanding Charges, Pending Court Dates, Parole/Probation? denied  Contacted To Inform of Risk of Harm To Self or Others: -- (na)    Does Patient Present under Involuntary Commitment? No    [place] of Residence: [place]   Patient Currently Receiving the Following Services: Not Receiving Services   Determination of Need: Urgent (48 hours)   Options For Referral: Facility-Based Crisis     CCA Biopsychosocial Patient Reported Schizophrenia/Schizoaffective Diagnosis in Past: No   Strengths: asking for help   Mental Health Symptoms Depression:   None   Duration of Depressive symptoms:    Mania:   None   Anxiety:    Worrying   Psychosis:   None   Duration of Psychotic symptoms:    Trauma:   Avoids reminders of event; Re-experience of traumatic event (Two adult children both killed within the last 4 months.)   Obsessions:   None   Compulsions:   None   Inattention:   N/A   Hyperactivity/Impulsivity:   N/A   Oppositional/Defiant Behaviors:   None   Emotional Irregularity:   None   Other Mood/Personality Symptoms:   none    Mental Status Exam Appearance and self-care  Stature:   Average   Weight:   Average weight   Clothing:   Casual; Neat/clean; Age-appropriate   Grooming:   Normal   Cosmetic use:   None   Posture/gait:   Normal   Motor activity:   Not Remarkable   Sensorium  Attention:    Normal   Concentration:   Normal   Orientation:   X5   Recall/memory:   Normal   Affect and Mood  Affect:   Depressed; Tearful   Mood:   Depressed   Relating  Eye contact:   Normal   Facial expression:   Depressed   Attitude toward examiner:   Cooperative   Thought and Language  Speech flow:  Clear and Coherent; Normal   Thought content:   Appropriate to Mood and Circumstances   Preoccupation:   Ruminations (children's death)   Hallucinations:   None   Organization:   Intact; [place] of Knowledge:   Average   Intelligence:   Average   Abstraction:   Normal   Judgement:   Normal   Reality Testing:   Adequate   Insight:   Good  Decision Making:   Normal   Social Functioning  Social Maturity:   Responsible   Social Judgement:   Normal   Stress  Stressors:   Grief/losses; Housing   Coping Ability:   Exhausted; Overwhelmed   Skill Deficits:   None   Supports:   Family; Support needed     Religion: Religion/Spirituality Are You A Religious Person?: No  Leisure/Recreation: Leisure / Recreation Do You Have Hobbies?: Yes Leisure and Hobbies: playing cards  Exercise/Diet: Exercise/Diet Do You Exercise?: No Have You Gained or Lost A Significant Amount of Weight in the Past Six Months?: No Do You Follow a Special Diet?: No Do You Have Any Trouble Sleeping?: Yes Explanation of Sleeping Difficulties: interrupted sleep   CCA Employment/Education Employment/Work Situation: Employment / Work Situation Employment Situation: Employed Agricultural consultant) Work Stressors: "functioning alcoholic" Patient's Job has Been Impacted by Current Illness: No Has Patient ever Been in the [employer]?: No  Education: Education Is Patient Currently Attending School?: No Last Grade Completed: 9 Did You Nutritional therapist?: No Did You Have An Individualized Education Program (IIEP): No (none reported) Did  You Have Any Difficulty At [employer]?: No Patient's Education Has Been Impacted by Current Illness: No   CCA Family/Childhood History Family and Relationship History: Family history Marital status: Single Does patient have children?: Yes How many children?: 2 (Both deceased in he last 4 months) How is patient's relationship with their children?: sudden deaths of both wintin the last 4 months  Childhood History:  Childhood History By whom was/is the patient raised?: Mother, Grandparents Did patient suffer any verbal/emotional/physical/sexual abuse as a child?: No Did patient suffer from severe childhood neglect?: No Has patient ever been sexually abused/assaulted/raped as an adolescent or adult?: No Was the patient ever a victim of a crime or a disaster?: No Witnessed domestic violence?: No Has patient been affected by domestic violence as an adult?: No       CCA Substance Use Alcohol/Drug Use: Alcohol / Drug Use Pain Medications: see MAR Prescriptions: see MAR Over the Counter: see MAR History of alcohol / drug use?: Yes Longest period of sobriety (when/how long): unknown Negative Consequences of Use:  (none) Withdrawal Symptoms: None (none reported) Substance #1 Name of Substance 1: alcohol 1 - Age of First Use: 13 1 - Amount (size/oz): up to 15 pack 1 - Frequency: daily 1 - Duration: ongoing 1 - Last Use / Amount: today 1 - Method of Aquiring: unknown 1- Route of Use: drink/oral                       ASAM's:  Six Dimensions of Multidimensional Assessment  Dimension 1:  Acute Intoxication and/or Withdrawal Potential:      Dimension 2:  Biomedical Conditions and Complications:      Dimension 3:  Emotional, Behavioral, or Cognitive Conditions and Complications:     Dimension 4:  Readiness to Change:     Dimension 5:  Relapse, Continued use, or Continued Problem Potential:     Dimension 6:  Recovery/Living Environment:     ASAM Severity Score:    ASAM  Recommended Level of Treatment: ASAM Recommended Level of Treatment: Level II Intensive Outpatient Treatment   Substance use Disorder (SUD)    Recommendations for Services/Supports/Treatments: Recommendations for Services/Supports/Treatments Recommendations For Services/Supports/Treatments: CD-IOP Intensive Chemical Dependency Program  Discharge Disposition:    DSM5 Diagnoses: Patient Active Problem List   Diagnosis Date Noted   Alcohol use disorder 08/21/2022   Tobacco use 12/30/2018  Cervical radiculopathy 12/30/2018   Alcohol use 12/30/2018     Referrals to Alternative Service(s): Referred to Alternative Service(s):   Place:   Date:   Time:    Referred to Alternative Service(s):   Place:   Date:   Time:    Referred to Alternative Service(s):   Place:   Date:   Time:    Referred to Alternative Service(s):   Place:   Date:   Time:     Jennye Runquist T, Counselor

## 2022-08-21 NOTE — ED Notes (Signed)
Patient discharged by provider Beatriz Stallion, NP with written and verbal instructions.

## 2023-01-28 ENCOUNTER — Emergency Department (HOSPITAL_COMMUNITY)
Admission: EM | Admit: 2023-01-28 | Discharge: 2023-01-29 | Disposition: A | Discharge status: Home / Self Care | Payer: Medicare HMO | Attending: Emergency Medicine | Admitting: Emergency Medicine

## 2023-01-28 ENCOUNTER — Encounter (HOSPITAL_COMMUNITY): Payer: Self-pay

## 2023-01-28 ENCOUNTER — Emergency Department (HOSPITAL_COMMUNITY): Payer: Medicare HMO

## 2023-01-28 ENCOUNTER — Other Ambulatory Visit: Payer: Self-pay

## 2023-01-28 DIAGNOSIS — R059 Cough, unspecified: Secondary | ICD-10-CM | POA: Diagnosis present

## 2023-01-28 DIAGNOSIS — J439 Emphysema, unspecified: Secondary | ICD-10-CM | POA: Diagnosis not present

## 2023-01-28 DIAGNOSIS — J984 Other disorders of lung: Secondary | ICD-10-CM

## 2023-01-28 DIAGNOSIS — Z20822 Contact with and (suspected) exposure to covid-19: Secondary | ICD-10-CM | POA: Insufficient documentation

## 2023-01-28 DIAGNOSIS — R0602 Shortness of breath: Secondary | ICD-10-CM | POA: Insufficient documentation

## 2023-01-28 DIAGNOSIS — R079 Chest pain, unspecified: Secondary | ICD-10-CM | POA: Diagnosis not present

## 2023-01-28 DIAGNOSIS — R051 Acute cough: Secondary | ICD-10-CM | POA: Insufficient documentation

## 2023-01-28 DIAGNOSIS — I700 Atherosclerosis of aorta: Secondary | ICD-10-CM | POA: Insufficient documentation

## 2023-01-28 LAB — CBC
HCT: 46.4 % (ref 39.0–52.0)
Hemoglobin: 15 g/dL (ref 13.0–17.0)
MCH: 29.3 pg (ref 26.0–34.0)
MCHC: 32.3 g/dL (ref 30.0–36.0)
MCV: 90.6 fL (ref 80.0–100.0)
Platelets: 298 10*3/uL (ref 150–400)
RBC: 5.12 MIL/uL (ref 4.22–5.81)
RDW: 14.5 % (ref 11.5–15.5)
WBC: 8.7 10*3/uL (ref 4.0–10.5)
nRBC: 0 % (ref 0.0–0.2)

## 2023-01-28 LAB — TROPONIN I (HIGH SENSITIVITY): Troponin I (High Sensitivity): 4 ng/L (ref <18)

## 2023-01-28 LAB — BASIC METABOLIC PANEL
Anion gap: 13 (ref 5–15)
BUN: <5 mg/dL — ABNORMAL LOW (ref 8–23)
CO2: 23 mmol/L (ref 22–32)
Calcium: 8.5 mg/dL — ABNORMAL LOW (ref 8.9–10.3)
Chloride: 103 mmol/L (ref 98–111)
Creatinine, Ser: 1 mg/dL (ref 0.61–1.24)
GFR, Estimated: >60 mL/min (ref >60)
Glucose, Bld: 99 mg/dL (ref 70–99)
Potassium: 3.5 mmol/L (ref 3.5–5.1)
Sodium: 139 mmol/L (ref 135–145)

## 2023-01-28 LAB — ETHANOL: Alcohol, Ethyl (B): <10 mg/dL (ref <10)

## 2023-01-28 NOTE — ED Triage Notes (Signed)
Pt from home c.o dry cough for 2 weeks and chest pain for 3 days. Pain in his chest is worse when he coughs and when he moves. Pt denies fever/chills or other cold/flu symptoms. Pt smokes 1/2 ppd.

## 2023-01-28 NOTE — ED Provider Triage Note (Signed)
Emergency Medicine Provider Triage Evaluation Note  George Rocha , a 67 y.o. male  was evaluated in triage.  Pt complains of left-sided chest pain for the past 3 days.  Review of Systems  Positive: Chest pain Negative: Shortness of breath, abdominal pain, back pain  Physical Exam  BP 102/62 (BP Location: Right Arm)   Pulse 67   Temp 98.4 F (36.9 C) (Oral)   Resp 16   SpO2 97%  Gen:   Awake, no distress   Resp:  Normal effort  MSK:   Moves extremities without difficulty  Other:    Medical Decision Making  Medically screening exam initiated at 8:14 PM.  Appropriate orders placed.  George Rocha was informed that the remainder of the evaluation will be completed by another provider, this initial triage assessment does not replace that evaluation, and the importance of remaining in the ED until their evaluation is complete.   Maxwell Marion, PA-C 01/28/23 2015

## 2023-01-29 ENCOUNTER — Emergency Department (HOSPITAL_COMMUNITY): Payer: Medicare HMO

## 2023-01-29 LAB — SARS CORONAVIRUS 2 BY RT PCR: SARS Coronavirus 2 by RT PCR: NEGATIVE

## 2023-01-29 MED ORDER — AZITHROMYCIN 250 MG PO TABS: 250.0000 mg | ORAL_TABLET | Freq: Every day | ORAL | 0 refills | Status: AC

## 2023-01-29 MED ORDER — IOHEXOL 350 MG/ML SOLN
75.0000 mL | Freq: Once | INTRAVENOUS | Status: AC | PRN
  Administered 2023-01-29: 75 mL via INTRAVENOUS

## 2023-01-29 NOTE — ED Provider Notes (Signed)
[facility] Provider Note   CSN: [id number] Arrival date & time: 01/28/23  2002     History  Chief Complaint  Patient presents with   Chest Pain   Cough    George Rocha is a 67 y.o. male.   Chest Pain Associated symptoms: cough and shortness of breath   Cough Associated symptoms: chest pain and shortness of breath      67 year old male presenting to the emerged apartment with left-sided chest discomfort for the past 3 days.  He endorses a cough with pleuritic chest pain that worsened over the last day.  He states that he since come to the emergency department and his improved somewhat and he feels "95% better."  He denies any fever, chills, sick contacts, nausea, vomiting, abdominal pain. He did feel SOB.  Home Medications Prior to Admission medications   Medication Sig Start Date End Date Taking? Authorizing Provider  azithromycin (ZITHROMAX) 250 MG tablet Take 1 tablet (250 mg total) by mouth daily. Take first 2 tablets together, then 1 every day until finished. 01/29/23  Yes Ernie Avena, MD  cephALEXin (KEFLEX) 500 MG capsule Take 1 capsule (500 mg total) by mouth 4 (four) times daily. 04/13/22   Blue, Soijett A, PA-C  cyclobenzaprine (FLEXERIL) 5 MG tablet Take 1 tablet (5 mg total) by mouth at bedtime. 12/30/18   Patel, Roxana Hires K, DO  diazepam (VALIUM) 5 MG tablet Take 1 tablet 30-min prior to MRI. 02/01/19   Nita Sickle K, DO  gabapentin (NEURONTIN) 300 MG capsule Take 1 tablet at bedtime for 1 week, then increase to 1 tablet twice daily 12/30/18   Nita Sickle K, DO  naproxen (NAPROSYN) 500 MG tablet Take 1 tablet (500 mg total) by mouth 2 (two) times daily. 04/13/22   Blue, Soijett A, PA-C      Allergies    Patient has no known allergies.    Review of Systems   Review of Systems  Respiratory:  Positive for cough and shortness of breath.   Cardiovascular:  Positive for chest pain.  All other systems reviewed and are  negative.   Physical Exam Updated Vital Signs BP 102/62 (BP Location: Right Arm)   Pulse (!) 58   Temp 98.4 F (36.9 C) (Oral)   Resp 16   SpO2 100%  Physical Exam Vitals and nursing note reviewed.  Constitutional:      General: He is not in acute distress.    Appearance: He is well-developed.  HENT:     Head: Normocephalic and atraumatic.  Eyes:     Conjunctiva/sclera: Conjunctivae normal.  Cardiovascular:     Rate and Rhythm: Normal rate and regular rhythm.     Heart sounds: No murmur heard. Pulmonary:     Effort: Pulmonary effort is normal. No respiratory distress.     Breath sounds: Normal breath sounds.  Abdominal:     Palpations: Abdomen is soft.     Tenderness: There is no abdominal tenderness.  Musculoskeletal:        General: No swelling.     Cervical back: Neck supple.  Skin:    General: Skin is warm and dry.     Capillary Refill: Capillary refill takes less than 2 seconds.  Neurological:     Mental Status: He is alert.  Psychiatric:        Mood and Affect: Mood normal.     ED Results / Procedures / Treatments   Labs (all labs ordered are  listed, but only abnormal results are displayed) Labs Reviewed  BASIC METABOLIC PANEL - Abnormal; Notable for the following components:      Result Value   BUN <5 (*)    Calcium 8.5 (*)    All other components within normal limits  RAPID URINE DRUG SCREEN, HOSP PERFORMED - Abnormal; Notable for the following components:   Cocaine POSITIVE (*)    All other components within normal limits  SARS CORONAVIRUS 2 BY RT PCR  CBC  ETHANOL  QUANTIFERON-TB GOLD PLUS  TROPONIN I (HIGH SENSITIVITY)  TROPONIN I (HIGH SENSITIVITY)    EKG EKG Interpretation Date/Time:  Tuesday January 28 2023 20:14:00 EDT Ventricular Rate:  67 PR Interval:  162 QRS Duration:  92 QT Interval:  416 QTC Calculation: 439 R Axis:   70  Text Interpretation: Normal sinus rhythm Nonspecific ST abnormality No significant change since last  tracing Confirmed by Cathren Laine ([id number]) on 01/28/2023 8:18:02 PM  Radiology CT Angio Chest PE W and/or Wo Contrast  Result Date: 01/29/2023 CLINICAL DATA:  Chest pain and shortness of breath EXAM: CT ANGIOGRAPHY CHEST WITH CONTRAST TECHNIQUE: Multidetector CT imaging of the chest was performed using the standard protocol during bolus administration of intravenous contrast. Multiplanar CT image reconstructions and MIPs were obtained to evaluate the vascular anatomy. RADIATION DOSE REDUCTION: This exam was performed according to the departmental dose-optimization program which includes automated exposure control, adjustment of the mA and/or kV according to patient size and/or use of iterative reconstruction technique. CONTRAST:  75mL OMNIPAQUE IOHEXOL 350 MG/ML SOLN COMPARISON:  Chest x-ray from the previous day. FINDINGS: Cardiovascular: Thoracic aorta demonstrates atherosclerotic calcifications. No aneurysmal dilatation or dissection is noted. The pulmonary artery shows a normal branching pattern bilaterally. No focal filling defect to suggest pulmonary embolism is noted. Mild coronary calcifications are noted. No cardiac enlargement is seen. Mediastinum/Nodes: Thoracic inlet is within normal limits. No hilar or mediastinal adenopathy is noted. The esophagus as visualized is within normal limits. Lungs/Pleura: Corresponding to the abnormality seen on recent chest x-ray, there is a 2.4 x 1.5 cm mildly spiculated area with minimal central cavitation within the left upper lobe. No other focal airspace opacity is noted. No effusion is seen. No parenchymal nodules are noted. Diffuse emphysematous changes are noted. Upper Abdomen: No acute abnormality. Musculoskeletal: Degenerative changes of the thoracic spine are noted. No acute bony abnormality is noted. Review of the MIP images confirms the above findings. IMPRESSION: Mildly spiculated area of airspace opacity with minimal central cavitation in the left upper  lobe. Given the elevated white blood cell count these changes suggest underlying inflammatory change although the possibility of neoplasm deserves consideration. Short-term follow-up CT following appropriate therapy is recommended to assess for resolution. No evidence of pulmonary emboli. Aortic Atherosclerosis (ICD10-I70.0) and Emphysema (ICD10-J43.9). Electronically Signed   By: Alcide Clever M.D.   On: 01/29/2023 02:27   DG Chest 1 View  Result Date: 01/28/2023 CLINICAL DATA:  Chest pain EXAM: PORTABLE CHEST 1 VIEW COMPARISON:  11/22/2017 FINDINGS: Cardiac shadow is within normal limits. Hazy somewhat nodular appearing density in the left apex is noted. Focal lesion could not be totally excluded. No focal infiltrate or effusion is noted. No bony abnormality is noted. IMPRESSION: Hazy nodular appearing density in the left apex. CT of the chest is recommended for further evaluation. Electronically Signed   By: Alcide Clever M.D.   On: 01/28/2023 21:00    Procedures Procedures    Medications Ordered in ED Medications  iohexol (OMNIPAQUE)  350 MG/ML injection 75 mL (75 mLs Intravenous Contrast Given 01/29/23 0216)    ED Course/ Medical Decision Making/ A&P                                 Medical Decision Making Amount and/or Complexity of Data Reviewed Radiology: ordered.  Risk Prescription drug management.    67 year old male presenting to the emerged apartment with left-sided chest discomfort for the past 3 days.  He endorses a cough with pleuritic chest pain that worsened over the last day.  He states that he since come to the emergency department and his improved somewhat and he feels "95% better."  He denies any fever, chills, sick contacts, nausea, vomiting, abdominal pain. He did feel SOB.  On arrival, the patient was afebrile, not tachycardic or tachypneic, saturating well on room air, hemodynamically stable.  Physical exam generally unremarkable with lungs clear to auscultation  bilaterally.  Differential diagnose includes viral infection, pneumonia, pneumothorax, PE, ACS.  Symptoms less likely ACS, symptoms not squeezing, ripping or tearing, low concern for aortic dissection.  EKG revealed sinus rhythm, ventricular rate 6 7, nonspecific ST changes, no significant change since previous tracings.  A chest x-ray performed revealed a hazy nodular appearing density in the left apex of the lungs, CT of the chest was recommended for further evaluation.  Labs: Troponin 4, CBC without a leukocytosis or anemia, ethanol level normal, BMP generally unremarkable.  COVID-19 PCR testing was collected.  Given the finding of an apical density, quant of urine was sent.  CTA PE study was ordered. Covid negative.  CTA PE:  IMPRESSION:  Mildly spiculated area of airspace opacity with minimal central  cavitation in the left upper lobe. Given the elevated white blood  cell count these changes suggest underlying inflammatory change  although the possibility of neoplasm deserves consideration.  Short-term follow-up CT following appropriate therapy is recommended  to assess for resolution.    No evidence of pulmonary emboli.    Aortic Atherosclerosis (ICD10-I70.0) and Emphysema (ICD10-J43.9).    Patient was overall well-appearing, not hypoxic, vitally stable.  Discussed with the patient the findings of a spiculated nodule in his lungs.  Could be inflammatory, could be underlying malignancy. Labs reassuring.  I recommended close follow-up with his PCP or pulmonology, will treat with a course of azithromycin.  Stable for discharge.  Final Clinical Impression(s) / ED Diagnoses Final diagnoses:  Acute cough  Cavitary lesion of lung    Rx / DC Orders ED Discharge Orders          Ordered    azithromycin (ZITHROMAX) 250 MG tablet  Daily        01/29/23 0239              Ernie Avena, MD 01/29/23 [phone]

## 2023-01-29 NOTE — Discharge Instructions (Addendum)
Your CT results revealed the following: IMPRESSION:  Mildly spiculated area of airspace opacity with minimal central  cavitation in the left upper lobe. Given the elevated white blood  cell count these changes suggest underlying inflammatory change  although the possibility of neoplasm deserves consideration.  Short-term follow-up CT following appropriate therapy is recommended  to assess for resolution.    No evidence of pulmonary emboli.    Aortic Atherosclerosis (ICD10-I70.0) and Emphysema (ICD10-J43.9).    Please follow-up with your PCP regarding these findings. We will treat you with a course of Azithromycin

## 2023-01-31 LAB — QUANTIFERON-TB GOLD PLUS (RQFGPL)
QuantiFERON Mitogen Value: >10 [IU]/mL
QuantiFERON Nil Value: 0.03 [IU]/mL
QuantiFERON TB1 Ag Value: 0.03 [IU]/mL
QuantiFERON TB2 Ag Value: 0.04 [IU]/mL

## 2023-01-31 LAB — QUANTIFERON-TB GOLD PLUS: QuantiFERON-TB Gold Plus: NEGATIVE
